# Patient Record
Sex: Female | Born: 1985 | Race: White | Hispanic: No | Marital: Married | Smoking: Never smoker
Health system: Southern US, Community
[De-identification: ages and names within clinical notes are randomized; demographics above are authoritative.]

## PROBLEM LIST (undated history)

## (undated) DIAGNOSIS — J45909 Unspecified asthma, uncomplicated: Secondary | ICD-10-CM

## (undated) DIAGNOSIS — M549 Dorsalgia, unspecified: Secondary | ICD-10-CM

## (undated) HISTORY — DX: Dorsalgia, unspecified: M54.9

## (undated) HISTORY — PX: NO PAST SURGERIES: SHX2092

---

## 2015-11-24 DIAGNOSIS — M5116 Intervertebral disc disorders with radiculopathy, lumbar region: Secondary | ICD-10-CM | POA: Insufficient documentation

## 2017-09-11 DIAGNOSIS — Z8041 Family history of malignant neoplasm of ovary: Secondary | ICD-10-CM | POA: Insufficient documentation

## 2018-12-30 ENCOUNTER — Ambulatory Visit: Payer: PRIVATE HEALTH INSURANCE | Admitting: General Practice

## 2018-12-30 ENCOUNTER — Other Ambulatory Visit: Payer: Self-pay

## 2018-12-30 DIAGNOSIS — Z3201 Encounter for pregnancy test, result positive: Secondary | ICD-10-CM | POA: Diagnosis not present

## 2018-12-30 LAB — POCT PREGNANCY, URINE: Preg Test, Ur: POSITIVE — AB

## 2018-12-30 NOTE — Progress Notes (Signed)
Patient presents to office today for UPT. UPT +. Patient reports first positive home test 11/22. LMP 11/12/18 EDD 08/19/19 [redacted]w[redacted]d. Patient reports taking PNV, Zyrtec, & Benadryl. This is her first pregnancy and she does not report significant medical hx. Encouraged patient to begin care around 10-12 weeks. She lives in [place] so she will contact [facility] office to begin care. 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 Advised against exposure to x-rays without precautions and discussed dietary recommendations. Patient verbalized understanding & had no other questions.  Koren Bound RN BSN 12/30/18

## 2019-01-22 NOTE — L&D Delivery Note (Signed)
OB/GYN Faculty Practice Delivery Note  Tracy Combs is a 34 y.o. G1P0 s/p VAVD at [redacted]w[redacted]d. She was admitted for SROM.   ROM: 6h 57m with clear fluid GBS Status: Positive/-- (06/28 0941) (inadequate treatment) Maximum Maternal Temperature: 98.80F  Labor Progress: . Initial SVE: 1.5/80/-2. Patient received one Cytotec and quickly progressed to complete. She did receive Epidural although not effective entirely for delivery.   Delivery Date/Time: 7/17 @ 1209 Delivery: Indication for operative vaginal delivery: Fetal bradycardia  Patient was examined and found to be fully dilated with fetal station of +2.  Patient's bladder was noted to be empty, and there were no known fetal contraindications to operative vaginal delivery. EFW was 3200g by [place]/recent ultrasound.  FHR tracing remarkable for heart rate in 80's for 8-9 minutes.  Risks of vacuum assistance were discussed in detail, including but not limited to, bleeding, infection, damage to maternal tissues, fetal cephalohematoma, inability to effect vaginal delivery of the head or shoulder dystocia that cannot be resolved by established maneuvers and need for emergency cesarean section.  Patient gave verbal consent.  The soft Kiwi vacuum cup was positioned over the sagittal suture 3 cm anterior to posterior fontanelle.  Pressure was then increased to 500 mmHg, and the patient was instructed to push.  Pulling was administered along the pelvic curve while patient was pushing; there were 3 contractions and 1 popoffs.  Vacuum was reduced in between contractions. Head delivered LOA. No nuchal cord present. Shoulder and body delivered in usual fashion. Infant with spontaneous cry, placed on mother's abdomen, dried and stimulated. Cord clamped x 2 after 1-minute delay, and cut by FOB. Cord blood drawn. Placenta delivered spontaneously with gentle cord traction. Fundus firm with massage and Pitocin. Labia, perineum, vagina, and cervix inspected inspected with  1st degree perineal laceration which was repaired with 3-0 Vicryl in a standard fashion.  Baby Weight: 2750g  Placenta: Sent to L&D Complications: Vacuum-Assisted Lacerations: 1st degree perineal  EBL: 372 mL Analgesia: Epidural and local lidocaine for repair   Infant:  APGAR (1 MIN): 8   APGAR (5 MINS): 9   APGAR (10 MINS):     Barrington Ellison, MD OB Family Medicine Fellow, [facility] for [facility] [place] [facility] 08/07/2019, 12:30 PM

## 2019-01-29 NOTE — Progress Notes (Signed)
I have reviewed this chart and agree with the RN/CMA assessment and management.    Mekayla Soman C Tijah Hane, MD, FACOG Attending Physician, Faculty Practice [facility]  

## 2019-02-02 ENCOUNTER — Other Ambulatory Visit (HOSPITAL_COMMUNITY)
Admission: RE | Admit: 2019-02-02 | Discharge: 2019-02-02 | Disposition: A | Discharge status: Home / Self Care | Payer: PRIVATE HEALTH INSURANCE | Source: Ambulatory Visit | Attending: Advanced Practice Midwife | Admitting: Advanced Practice Midwife

## 2019-02-02 ENCOUNTER — Other Ambulatory Visit: Payer: Self-pay

## 2019-02-02 ENCOUNTER — Encounter: Payer: Self-pay | Admitting: Advanced Practice Midwife

## 2019-02-02 ENCOUNTER — Ambulatory Visit (INDEPENDENT_AMBULATORY_CARE_PROVIDER_SITE_OTHER): Payer: PRIVATE HEALTH INSURANCE | Admitting: Advanced Practice Midwife

## 2019-02-02 DIAGNOSIS — Z3401 Encounter for supervision of normal first pregnancy, first trimester: Secondary | ICD-10-CM | POA: Insufficient documentation

## 2019-02-02 DIAGNOSIS — O09511 Supervision of elderly primigravida, first trimester: Secondary | ICD-10-CM

## 2019-02-02 DIAGNOSIS — O09519 Supervision of elderly primigravida, unspecified trimester: Secondary | ICD-10-CM | POA: Insufficient documentation

## 2019-02-02 DIAGNOSIS — Z3A11 11 weeks gestation of pregnancy: Secondary | ICD-10-CM

## 2019-02-02 NOTE — Patient Instructions (Addendum)
Second Trimester of Pregnancy The second trimester is from week 14 through week 27 (months 4 through 6). The second trimester is often a time when you feel your best. Your body has adjusted to being pregnant, and you begin to feel better physically. Usually, morning sickness has lessened or quit completely, you may have more energy, and you may have an increase in appetite. The second trimester is also a time when the fetus is growing rapidly. At the end of the sixth month, the fetus is about 9 inches long and weighs about 1 pounds. You will likely begin to feel the baby move (quickening) between 16 and 20 weeks of pregnancy. Body changes during your second trimester Your body continues to go through many changes during your second trimester. The changes vary from woman to woman.  Your weight will continue to increase. You will notice your lower abdomen bulging out.  You may begin to get stretch marks on your hips, abdomen, and breasts.  You may develop headaches that can be relieved by medicines. The medicines should be approved by your health care provider.  You may urinate more often because the fetus is pressing on your bladder.  You may develop or continue to have heartburn as a result of your pregnancy.  You may develop constipation because certain hormones are causing the muscles that push waste through your intestines to slow down.  You may develop hemorrhoids or swollen, bulging veins (varicose veins).  You may have back pain. This is caused by: ? Weight gain. ? Pregnancy hormones that are relaxing the joints in your pelvis. ? A shift in weight and the muscles that support your balance.  Your breasts will continue to grow and they will continue to become tender.  Your gums may bleed and may be sensitive to brushing and flossing.  Dark spots or blotches (chloasma, mask of pregnancy) may develop on your face. This will likely fade after the baby is born.  A dark line from your  belly button to the pubic area (linea nigra) may appear. This will likely fade after the baby is born.  You may have changes in your hair. These can include thickening of your hair, rapid growth, and changes in texture. Some women also have hair loss during or after pregnancy, or hair that feels dry or thin. Your hair will most likely return to normal after your baby is born. What to expect at prenatal visits During a routine prenatal visit:  You will be weighed to make sure you and the fetus are growing normally.  Your blood pressure will be taken.  Your abdomen will be measured to track your baby's growth.  The fetal heartbeat will be listened to.  Any test results from the previous visit will be discussed. Your health care provider may ask you:  How you are feeling.  If you are feeling the baby move.  If you have had any abnormal symptoms, such as leaking fluid, bleeding, severe headaches, or abdominal cramping.  If you are using any tobacco products, including cigarettes, chewing tobacco, and electronic cigarettes.  If you have any questions. Other tests that may be performed during your second trimester include:  Blood tests that check for: ? Low iron levels (anemia). ? High blood sugar that affects pregnant women (gestational diabetes) between 24 and 28 weeks. ? Rh antibodies. This is to check for a protein on red blood cells (Rh factor).  Urine tests to check for infections, diabetes, or protein in the   urine.  An ultrasound to confirm the proper growth and development of the baby.  An amniocentesis to check for possible genetic problems.  Fetal screens for spina bifida and Down syndrome.  HIV (human immunodeficiency virus) testing. Routine prenatal testing includes screening for HIV, unless you choose not to have this test. Follow these instructions at home: Medicines  Follow your health care provider's instructions regarding medicine use. Specific medicines may be  either safe or unsafe to take during pregnancy.  Take a prenatal vitamin that contains at least 600 micrograms (mcg) of folic acid.  If you develop constipation, try taking a stool softener if your health care provider approves. Eating and drinking   Eat a balanced diet that includes fresh fruits and vegetables, whole grains, good sources of protein such as meat, eggs, or tofu, and low-fat dairy. Your health care provider will help you determine the amount of weight gain that is right for you.  Avoid raw meat and uncooked cheese. These carry germs that can cause birth defects in the baby.  If you have low calcium intake from food, talk to your health care provider about whether you should take a daily calcium supplement.  Limit foods that are high in fat and processed sugars, such as fried and sweet foods.  To prevent constipation: ? Drink enough fluid to keep your urine clear or pale yellow. ? Eat foods that are high in fiber, such as fresh fruits and vegetables, whole grains, and beans. Activity  Exercise only as directed by your health care provider. Most women can continue their usual exercise routine during pregnancy. Try to exercise for 30 minutes at least 5 days a week. Stop exercising if you experience uterine contractions.  Avoid heavy lifting, wear low heel shoes, and practice good posture.  A sexual relationship may be continued unless your health care provider directs you otherwise. Relieving pain and discomfort  Wear a good support bra to prevent discomfort from breast tenderness.  Take warm sitz baths to soothe any pain or discomfort caused by hemorrhoids. Use hemorrhoid cream if your health care provider approves.  Rest with your legs elevated if you have leg cramps or low back pain.  If you develop varicose veins, wear support hose. Elevate your feet for 15 minutes, 3-4 times a day. Limit salt in your diet. Prenatal Care  Write down your questions. Take them to  your prenatal visits.  Keep all your prenatal visits as told by your health care provider. This is important. Safety  Wear your seat belt at all times when driving.  Make a list of emergency phone numbers, including numbers for family, friends, the hospital, and police and fire departments. General instructions  Ask your health care provider for a referral to a local prenatal education class. Begin classes no later than the beginning of month 6 of your pregnancy.  Ask for help if you have counseling or nutritional needs during pregnancy. Your health care provider can offer advice or refer you to specialists for help with various needs.  Do not use hot tubs, steam rooms, or saunas.  Do not douche or use tampons or scented sanitary pads.  Do not cross your legs for long periods of time.  Avoid cat litter boxes and soil used by cats. These carry germs that can cause birth defects in the baby and possibly loss of the fetus by miscarriage or stillbirth.  Avoid all smoking, herbs, alcohol, and unprescribed drugs. Chemicals in these products can affect the formation   and growth of the baby.  Do not use any products that contain nicotine or tobacco, such as cigarettes and e-cigarettes. If you need help quitting, ask your health care provider.  Visit your dentist if you have not gone yet during your pregnancy. Use a soft toothbrush to brush your teeth and be gentle when you floss. Contact a health care provider if:  You have dizziness.  You have mild pelvic cramps, pelvic pressure, or nagging pain in the abdominal area.  You have persistent nausea, vomiting, or diarrhea.  You have a bad smelling vaginal discharge.  You have pain when you urinate. Get help right away if:  You have a fever.  You are leaking fluid from your vagina.  You have spotting or bleeding from your vagina.  You have severe abdominal cramping or pain.  You have rapid weight gain or weight loss.  You have  shortness of breath with chest pain.  You notice sudden or extreme swelling of your face, hands, ankles, feet, or legs.  You have not felt your baby move in over an hour.  You have severe headaches that do not go away when you take medicine.  You have vision changes. Summary  The second trimester is from week 14 through week 27 (months 4 through 6). It is also a time when the fetus is growing rapidly.  Your body goes through many changes during pregnancy. The changes vary from woman to woman.  Avoid all smoking, herbs, alcohol, and unprescribed drugs. These chemicals affect the formation and growth your baby.  Do not use any tobacco products, such as cigarettes, chewing tobacco, and e-cigarettes. If you need help quitting, ask your health care provider.  Contact your health care provider if you have any questions. Keep all prenatal visits as told by your health care provider. This is important. This information is not intended to replace advice given to you by your health care provider. Make sure you discuss any questions you have with your health care provider. Document Revised: 05/01/2018 Document Reviewed: 02/13/2016 Elsevier Patient Education  [place] of Pregnancy The first trimester of pregnancy is from week 1 until the end of week 13 (months 1 through 3). A week after a sperm fertilizes an egg, the egg will implant on the wall of the uterus. This embryo will begin to develop into a baby. Genes from you and your partner will form the baby. The female genes will determine whether the baby will be a boy or a girl. At 6-8 weeks, the eyes and face will be formed, and the heartbeat can be seen on ultrasound. At the end of 12 weeks, all the baby's organs will be formed. Now that you are pregnant, you will want to do everything you can to have a healthy baby. Two of the most important things are to get good prenatal care and to follow your health care provider's  instructions. Prenatal care is all the medical care you receive before the baby's birth. This care will help prevent, find, and treat any problems during the pregnancy and childbirth. Body changes during your first trimester Your body goes through many changes during pregnancy. The changes vary from woman to woman.  You may gain or lose a couple of pounds at first.  You may feel sick to your stomach (nauseous) and you may throw up (vomit). If the vomiting is uncontrollable, call your health care provider.  You may tire easily.  You may develop headaches that  can be relieved by medicines. All medicines should be approved by your health care provider.  You may urinate more often. Painful urination may mean you have a bladder infection.  You may develop heartburn as a result of your pregnancy.  You may develop constipation because certain hormones are causing the muscles that push stool through your intestines to slow down.  You may develop hemorrhoids or swollen veins (varicose veins).  Your breasts may begin to grow larger and become tender. Your nipples may stick out more, and the tissue that surrounds them (areola) may become darker.  Your gums may bleed and may be sensitive to brushing and flossing.  Dark spots or blotches (chloasma, mask of pregnancy) may develop on your face. This will likely fade after the baby is born.  Your menstrual periods will stop.  You may have a loss of appetite.  You may develop cravings for certain kinds of food.  You may have changes in your emotions from day to day, such as being excited to be pregnant or being concerned that something may go wrong with the pregnancy and baby.  You may have more vivid and strange dreams.  You may have changes in your hair. These can include thickening of your hair, rapid growth, and changes in texture. Some women also have hair loss during or after pregnancy, or hair that feels dry or thin. Your hair will most  likely return to normal after your baby is born. What to expect at prenatal visits During a routine prenatal visit:  You will be weighed to make sure you and the baby are growing normally.  Your blood pressure will be taken.  Your abdomen will be measured to track your baby's growth.  The fetal heartbeat will be listened to between weeks 10 and 14 of your pregnancy.  Test results from any previous visits will be discussed. Your health care provider may ask you:  How you are feeling.  If you are feeling the baby move.  If you have had any abnormal symptoms, such as leaking fluid, bleeding, severe headaches, or abdominal cramping.  If you are using any tobacco products, including cigarettes, chewing tobacco, and electronic cigarettes.  If you have any questions. Other tests that may be performed during your first trimester include:  Blood tests to find your blood type and to check for the presence of any previous infections. The tests will also be used to check for low iron levels (anemia) and protein on red blood cells (Rh antibodies). Depending on your risk factors, or if you previously had diabetes during pregnancy, you may have tests to check for high blood sugar that affects pregnant women (gestational diabetes).  Urine tests to check for infections, diabetes, or protein in the urine.  An ultrasound to confirm the proper growth and development of the baby.  Fetal screens for spinal cord problems (spina bifida) and Down syndrome.  HIV (human immunodeficiency virus) testing. Routine prenatal testing includes screening for HIV, unless you choose not to have this test.  You may need other tests to make sure you and the baby are doing well. Follow these instructions at home: Medicines  Follow your health care provider's instructions regarding medicine use. Specific medicines may be either safe or unsafe to take during pregnancy.  Take a prenatal vitamin that contains at least  600 micrograms (mcg) of folic acid.  If you develop constipation, try taking a stool softener if your health care provider approves. Eating and drinking  Eat a balanced diet that includes fresh fruits and vegetables, whole grains, good sources of protein such as meat, eggs, or tofu, and low-fat dairy. Your health care provider will help you determine the amount of weight gain that is right for you.  Avoid raw meat and uncooked cheese. These carry germs that can cause birth defects in the baby.  Eating four or five small meals rather than three large meals a day may help relieve nausea and vomiting. If you start to feel nauseous, eating a few soda crackers can be helpful. Drinking liquids between meals, instead of during meals, also seems to help ease nausea and vomiting.  Limit foods that are high in fat and processed sugars, such as fried and sweet foods.  To prevent constipation: ? Eat foods that are high in fiber, such as fresh fruits and vegetables, whole grains, and beans. ? Drink enough fluid to keep your urine clear or pale yellow. Activity  Exercise only as directed by your health care provider. Most women can continue their usual exercise routine during pregnancy. Try to exercise for 30 minutes at least 5 days a week. Exercising will help you: ? Control your weight. ? Stay in shape. ? Be prepared for labor and delivery.  Experiencing pain or cramping in the lower abdomen or lower back is a good sign that you should stop exercising. Check with your health care provider before continuing with normal exercises.  Try to avoid standing for long periods of time. Move your legs often if you must stand in one place for a long time.  Avoid heavy lifting.  Wear low-heeled shoes and practice good posture.  You may continue to have sex unless your health care provider tells you not to. Relieving pain and discomfort  Wear a good support bra to relieve breast tenderness.  Take warm  sitz baths to soothe any pain or discomfort caused by hemorrhoids. Use hemorrhoid cream if your health care provider approves.  Rest with your legs elevated if you have leg cramps or low back pain.  If you develop varicose veins in your legs, wear support hose. Elevate your feet for 15 minutes, 3-4 times a day. Limit salt in your diet. Prenatal care  Schedule your prenatal visits by the twelfth week of pregnancy. They are usually scheduled monthly at first, then more often in the last 2 months before delivery.  Write down your questions. Take them to your prenatal visits.  Keep all your prenatal visits as told by your health care provider. This is important. Safety  Wear your seat belt at all times when driving.  Make a list of emergency phone numbers, including numbers for family, friends, the hospital, and police and fire departments. General instructions  Ask your health care provider for a referral to a local prenatal education class. Begin classes no later than the beginning of month 6 of your pregnancy.  Ask for help if you have counseling or nutritional needs during pregnancy. Your health care provider can offer advice or refer you to specialists for help with various needs.  Do not use hot tubs, steam rooms, or saunas.  Do not douche or use tampons or scented sanitary pads.  Do not cross your legs for long periods of time.  Avoid cat litter boxes and soil used by cats. These carry germs that can cause birth defects in the baby and possibly loss of the fetus by miscarriage or stillbirth.  Avoid all smoking, herbs, alcohol, and medicines not prescribed  by your health care provider. Chemicals in these products affect the formation and growth of the baby.  Do not use any products that contain nicotine or tobacco, such as cigarettes and e-cigarettes. If you need help quitting, ask your health care provider. You may receive counseling support and other resources to help you  quit.  Schedule a dentist appointment. At home, brush your teeth with a soft toothbrush and be gentle when you floss. Contact a health care provider if:  You have dizziness.  You have mild pelvic cramps, pelvic pressure, or nagging pain in the abdominal area.  You have persistent nausea, vomiting, or diarrhea.  You have a bad smelling vaginal discharge.  You have pain when you urinate.  You notice increased swelling in your face, hands, legs, or ankles.  You are exposed to fifth disease or chickenpox.  You are exposed to [place] measles (rubella) and have never had it. Get help right away if:  You have a fever.  You are leaking fluid from your vagina.  You have spotting or bleeding from your vagina.  You have severe abdominal cramping or pain.  You have rapid weight gain or loss.  You vomit blood or material that looks like coffee grounds.  You develop a severe headache.  You have shortness of breath.  You have any kind of trauma, such as from a fall or a car accident. Summary  The first trimester of pregnancy is from week 1 until the end of week 13 (months 1 through 3).  Your body goes through many changes during pregnancy. The changes vary from woman to woman.  You will have routine prenatal visits. During those visits, your health care provider will examine you, discuss any test results you may have, and talk with you about how you are feeling. This information is not intended to replace advice given to you by your health care provider. Make sure you discuss any questions you have with your health care provider. Document Revised: 12/20/2016 Document Reviewed: 12/20/2015 Elsevier Patient Education  2020 [place] During Pregnancy Genetic testing during pregnancy is also called prenatal genetic testing. This type of testing can determine if your baby is at risk of being born with a disorder caused by abnormal genes or chromosomes (genetic  disorder). Chromosomes contain genes that control how your baby will develop in your womb. There are many different genetic disorders. Examples of genetic disorders that may be found through genetic testing include Down syndrome and cystic fibrosis. Gene changes (mutations) can be passed down through families. Genetic testing is offered to all women before or during pregnancy. You can choose whether to have genetic testing. Why is genetic testing done? Genetic testing is done during pregnancy to find out whether your child is at risk for a genetic disorder. Having genetic testing allows you to:  Discuss your test results and options with a genetic counselor.  Prepare for a baby that may be born with a genetic disorder. Learning about the disorder ahead of time helps you be better prepared to manage it. Your health care providers can also be prepared in case your baby requires special care before or after birth.  Consider whether you want to continue with the pregnancy. In some cases, genetic testing may be done to learn about the traits a child will inherit. Types of genetic tests There are two basic types of genetic testing. Screening tests indicate whether your developing baby (fetus) is at higher risk for a genetic  disorder. Diagnostic tests check actual fetal cells to diagnose a genetic disorder. Screening tests     Screening tests will not harm your baby. They are recommended for all pregnant women. Types of screening tests include:  Carrier screening. This test involves checking genes from both parents by testing their blood or saliva. The test checks to find out if the parents carry a genetic mutation that may be passed to a baby. In most cases, both parents must carry the mutation for a baby to be at risk.  First trimester screening. This test combines a blood test with sound wave imaging of your baby (fetal ultrasound). This screening test checks for a risk of Down syndrome or other  defects caused by having extra chromosomes. It also checks for defects of the heart, abdomen, or skeleton.  Second trimester screening also combines a blood test with a fetal ultrasound exam. It checks for a risk of genetic defects of the face, brain, spine, heart, or limbs.  Combined or sequential screening. This type of testing combines the results of first and second trimester screening. This type of testing may be more accurate than first or second trimester screening alone.  Cell-free DNA testing. This is a blood test that detects cells released by the placenta that get into the mother's blood. It can be used to check for a risk of Down syndrome, other extra chromosome syndromes, and disorders caused by abnormal numbers of sex chromosomes. This test can be done any time after 10 weeks of pregnancy.  Diagnostic tests Diagnostic tests carry slight risks of problems, including bleeding, infection, and loss of the pregnancy. These tests are done only if your baby is at risk for a genetic disorder. You may meet with a genetic counselor to discuss the risks and benefits before having diagnostic tests. Examples of diagnostic tests include:  Chorionic villus sampling (CVS). This involves a procedure to remove and test a sample of cells taken from the placenta. The procedure may be done between 10 and 12 weeks of pregnancy.  Amniocentesis. This involves a procedure to remove and test a sample of fluid (amniotic fluid) and cells from the sac that surrounds the developing baby. The procedure may be done between 15 and 20 weeks of pregnancy. What do the results mean? For a screening test:  If the results are negative, it often means that your child is not at higher risk. There is still a slight chance your child could have a genetic disorder.  If the results are positive, it does not mean your child will have a genetic disorder. It may mean that your child has a higher-than-normal risk for a genetic  disorder. In that case, you may want to talk with a genetic counselor about whether you should have diagnostic genetic tests. For a diagnostic test:  If the result is negative, it is unlikely that your child will have a genetic disorder.  If the test is positive for a genetic disorder, it is likely that your child will have the disorder. The test may not tell how severe the disorder will be. Talk with your health care provider about your options. Questions to ask your health care provider Before talking to your health care provider about genetic testing, find out if there is a history of genetic disorders in your family. It may also help to know your family's ethnic origins. Then ask your health care provider the following questions:  Is my baby at risk for a genetic disorder?  What  are the benefits of having genetic screening?  What tests are best for me and my baby?  What are the risks of each test?  If I get a positive result on a screening test, what is the next step?  Should I meet with a genetic counselor before having a diagnostic test?  Should my partner or other members of my family be tested?  How much do the tests cost? Will my insurance cover the testing? Summary  Genetic testing is done during pregnancy to find out whether your child is at risk for a genetic disorder.  Genetic testing is offered to all women before or during pregnancy. You can choose whether to have genetic testing.  There are two basic types of genetic testing. Screening tests indicate whether your developing baby (fetus) is at higher risk for a genetic disorder. Diagnostic tests check actual fetal cells to diagnose a genetic disorder.  If a diagnostic genetic test is positive, talk with your health care provider about your options. This information is not intended to replace advice given to you by your health care provider. Make sure you discuss any questions you have with your health care  provider. Document Revised: 04/30/2018 Document Reviewed: 03/24/2017 Elsevier Patient Education  [place].

## 2019-02-02 NOTE — Progress Notes (Signed)
DATING AND VIABILITY SONOGRAM   Tracy Combs is a 34 y.o. year old G1P0 with LMP Patient's last menstrual period was 11/12/2018 (exact date). which would correlate to  [redacted]w[redacted]d weeks gestation.  She has regular menstrual cycles.   She is here today for a confirmatory initial sonogram.    GESTATION: SINGLETON  FETAL ACTIVITY:          Heart rate         158 bpm          The fetus is active.    GESTATIONAL AGE AND  BIOMETRICS:  Gestational criteria: Estimated Date of Delivery: 08/19/19 by LMP now at [redacted]w[redacted]d  Previous Scans:0      CROWN RUMP LENGTH           5.10 cm         11-5 weeks                                                                               AVERAGE EGA(BY THIS SCAN): 11-5 weeks  WORKING EDD( LMP ): 08/19/2019     TECHNICIAN COMMENTS: Patient informed that the ultrasound is considered a limited obstetric ultrasound and is not intended to be a complete ultrasound exam. Patient also informed that the ultrasound is not being completed with the intent of assessing for fetal or placental anomalies or any pelvic abnormalities. Explained that the purpose of today's ultrasound is to assess for fetal heart rate. Patient acknowledges the purpose of the exam and the limitations of the study   Kathrene Alu 02/02/2019 9:41 AM

## 2019-02-02 NOTE — Progress Notes (Signed)
   PRENATAL VISIT NOTE  Subjective:  Tracy Combs is a 34 y.o. G1P0 at [redacted]w[redacted]d being seen today for her first prenatal visit for this pregnancy.  She is currently monitored for the following issues for this low-risk pregnancy and has Encounter for supervision of normal first pregnancy in first trimester; AMA (advanced maternal age) primigravida 56+; Family history of ovarian cancer; and Intervertebral disc disorders with radiculopathy, lumbar region on their problem list.  Patient reports no complaints.  Contractions: Not present. Vag. Bleeding: None.   . Denies leaking of fluid.   She is planning to breastfeed. Desires IUD (probably)  for contraception.   Works as a Occupational psychologist in [place]  The following portions of the patient's history were reviewed and updated as appropriate: allergies, current medications, past family history, past medical history, past social history, past surgical history and problem list.   Objective:   Vitals:   02/02/19 0908 02/02/19 0909  BP: 134/81   Pulse: 90   Weight: 154 lb (69.9 kg)   Height:  5\' 1"  (1.549 m)    Fetal Status: Fetal Heart Rate (bpm): 158         General:  Alert, oriented and cooperative. Patient is in no acute distress.  Skin: Skin is warm and dry. No rash noted.   Cardiovascular: Normal heart rate and rhythm noted  Respiratory: Normal respiratory effort, no problems with respiration noted. Clear to auscultation.   Abdomen: Soft, gravid, appropriate for gestational age. Normal bowel sounds. Non-tender. Pain/Pressure: Absent     Pelvic: Cervical exam performed       Normal cervical contour, no lesions, no bleeding following pap, normal discharge     Pap done  Extremities: Normal range of motion.  Edema: None  Mental Status: Normal mood and affect. Normal behavior. Normal judgment and thought content.   Assessment and Plan:  Pregnancy: G1P0 at [redacted]w[redacted]d 1. Encounter for supervision of normal first pregnancy in first trimester  Welcomed to practice     Reviewed team and involvement of learners     Routines reviewed     Discussed Panorama, may want to do it (recommended after 12 weeks with AFP after 15w)  - Obstetric Panel (and HIV)*LC - Urine Culture - Cytology - PAP( [place]) - Enroll Patient in Babyscripts - Cystic Fibrosis*LC - SMN1 Copy Number Analysis - [place] MFM OB COMP + 36 WK; Future  2. Primigravida of advanced maternal age in first trimester  - Obstetric Panel (and HIV)*LC - Urine Culture - Cytology - PAP( [place]) - Enroll Patient in Babyscripts - Cystic Fibrosis*LC - SMN1 Copy Number Analysis  Preterm labor symptoms and general obstetric precautions including but not limited to vaginal bleeding, contractions, leaking of fluid and fetal movement were reviewed in detail with the patient. Please refer to After Visit Summary for other counseling recommendations.   Return in about 4 weeks (around 03/02/2019) for [facility].  Future Appointments  Date Time Provider [place]  03/02/2019  9:15 AM Seabron Spates, CNM [facility] None  03/08/2019  9:30 AM [place] 1 [facility]-MFCUS M[facility]-US    Hansel Feinstein, CNM

## 2019-02-03 LAB — OBSTETRIC PANEL, INCLUDING HIV
Antibody Screen: NEGATIVE
Basophils Absolute: 0 10*3/uL (ref 0.0–0.2)
Basos: 0 %
EOS (ABSOLUTE): 0.1 10*3/uL (ref 0.0–0.4)
Eos: 2 %
HIV Screen 4th Generation wRfx: NONREACTIVE
Hematocrit: 37 % (ref 34.0–46.6)
Hemoglobin: 12.5 g/dL (ref 11.1–15.9)
Hepatitis B Surface Ag: NEGATIVE
Immature Grans (Abs): 0 10*3/uL (ref 0.0–0.1)
Immature Granulocytes: 0 %
Lymphocytes Absolute: 1.2 10*3/uL (ref 0.7–3.1)
Lymphs: 19 %
MCH: 31.6 pg (ref 26.6–33.0)
MCHC: 33.8 g/dL (ref 31.5–35.7)
MCV: 93 fL (ref 79–97)
Monocytes Absolute: 0.4 10*3/uL (ref 0.1–0.9)
Monocytes: 6 %
Neutrophils Absolute: 4.7 10*3/uL (ref 1.4–7.0)
Neutrophils: 73 %
Platelets: 223 10*3/uL (ref 150–450)
RBC: 3.96 x10E6/uL (ref 3.77–5.28)
RDW: 12.1 % (ref 11.7–15.4)
RPR Ser Ql: NONREACTIVE
Rh Factor: POSITIVE
Rubella Antibodies, IGG: 7.46 index (ref >0.99)
WBC: 6.4 10*3/uL (ref 3.4–10.8)

## 2019-02-04 LAB — URINE CULTURE

## 2019-02-05 LAB — CYTOLOGY - PAP
Chlamydia: NEGATIVE
Comment: NEGATIVE
Comment: NEGATIVE
Comment: NORMAL
Diagnosis: NEGATIVE
High risk HPV: NEGATIVE
Neisseria Gonorrhea: NEGATIVE

## 2019-02-15 LAB — SMN1 COPY NUMBER ANALYSIS (SMA CARRIER SCREENING)

## 2019-02-15 LAB — CYSTIC FIBROSIS MUTATION 97: Interpretation: NOT DETECTED

## 2019-03-02 ENCOUNTER — Encounter: Payer: Self-pay | Admitting: Advanced Practice Midwife

## 2019-03-02 ENCOUNTER — Ambulatory Visit (INDEPENDENT_AMBULATORY_CARE_PROVIDER_SITE_OTHER): Payer: PRIVATE HEALTH INSURANCE | Admitting: Advanced Practice Midwife

## 2019-03-02 ENCOUNTER — Other Ambulatory Visit: Payer: Self-pay

## 2019-03-02 VITALS — BP 115/65 | HR 86 | Wt 156.1 lb

## 2019-03-02 DIAGNOSIS — O09512 Supervision of elderly primigravida, second trimester: Secondary | ICD-10-CM

## 2019-03-02 DIAGNOSIS — Z3401 Encounter for supervision of normal first pregnancy, first trimester: Secondary | ICD-10-CM

## 2019-03-02 DIAGNOSIS — Z3482 Encounter for supervision of other normal pregnancy, second trimester: Secondary | ICD-10-CM

## 2019-03-02 DIAGNOSIS — Z3A15 15 weeks gestation of pregnancy: Secondary | ICD-10-CM

## 2019-03-02 NOTE — Patient Instructions (Signed)

## 2019-03-02 NOTE — Progress Notes (Signed)
   PRENATAL VISIT NOTE  Subjective:  Tracy Combs is a 34 y.o. G1P0 at [redacted]w[redacted]d being seen today for ongoing prenatal care.  She is currently monitored for the following issues for this low-risk pregnancy and has Encounter for supervision of normal first pregnancy in first trimester; AMA (advanced maternal age) primigravida 80+; Family history of ovarian cancer; and Intervertebral disc disorders with radiculopathy, lumbar region on their problem list.  Patient reports intermittent sharp pains in abdomen, no cramps.  Contractions: Not present. Vag. Bleeding: None.  Movement: Absent. Denies leaking of fluid.   The following portions of the patient's history were reviewed and updated as appropriate: allergies, current medications, past family history, past medical history, past social history, past surgical history and problem list.   Objective:   Vitals:   03/02/19 0926  BP: 115/65  Pulse: 86  Weight: 156 lb 1.3 oz (70.8 kg)    Fetal Status:     Movement: Absent     General:  Alert, oriented and cooperative. Patient is in no acute distress.  Skin: Skin is warm and dry. No rash noted.   Cardiovascular: Normal heart rate noted  Respiratory: Normal respiratory effort, no problems with respiration noted  Abdomen: Soft, gravid, appropriate for gestational age.  Pain/Pressure: Present     Pelvic: Cervical exam deferred        Extremities: Normal range of motion.  Edema: None  Mental Status: Normal mood and affect. Normal behavior. Normal judgment and thought content.   Assessment and Plan:  Pregnancy: G1P0 at [redacted]w[redacted]d 1. Encounter for supervision of other normal pregnancy in second trimester     Panorama and AFP today     Has [place] scheduled 03/22/19  Preterm labor symptoms and general obstetric precautions including but not limited to vaginal bleeding, contractions, leaking of fluid and fetal movement were reviewed in detail with the patient. Please refer to After Visit Summary for other counseling  recommendations.   Return in about 4 weeks (around 03/30/2019) for [employer], TELEHEALTH VISIT.  Future Appointments  Date Time Provider [place]  03/08/2019  9:30 AM [facility] [place]    Hansel Feinstein, CNM

## 2019-03-04 LAB — AFP, SERUM, OPEN SPINA BIFIDA
AFP MoM: 1.32
AFP Value: 40 ng/mL
Gest. Age on Collection Date: 15.5 weeks
Maternal Age At EDD: 35 yr
OSBR Risk 1 IN: 4529
Test Results:: NEGATIVE
Weight: 156 [lb_av]

## 2019-03-08 ENCOUNTER — Ambulatory Visit (HOSPITAL_COMMUNITY): Payer: PRIVATE HEALTH INSURANCE

## 2019-03-22 ENCOUNTER — Other Ambulatory Visit: Payer: Self-pay

## 2019-03-22 ENCOUNTER — Other Ambulatory Visit (HOSPITAL_COMMUNITY): Payer: Self-pay | Admitting: *Deleted

## 2019-03-22 ENCOUNTER — Other Ambulatory Visit: Payer: Self-pay | Admitting: Advanced Practice Midwife

## 2019-03-22 ENCOUNTER — Ambulatory Visit (HOSPITAL_COMMUNITY)
Admission: RE | Admit: 2019-03-22 | Discharge: 2019-03-22 | Disposition: A | Discharge status: Home / Self Care | Payer: PRIVATE HEALTH INSURANCE | Source: Ambulatory Visit | Attending: Advanced Practice Midwife | Admitting: Advanced Practice Midwife

## 2019-03-22 DIAGNOSIS — Z3689 Encounter for other specified antenatal screening: Secondary | ICD-10-CM | POA: Diagnosis not present

## 2019-03-22 DIAGNOSIS — O3412 Maternal care for benign tumor of corpus uteri, second trimester: Secondary | ICD-10-CM | POA: Insufficient documentation

## 2019-03-22 DIAGNOSIS — O09512 Supervision of elderly primigravida, second trimester: Secondary | ICD-10-CM | POA: Diagnosis not present

## 2019-03-22 DIAGNOSIS — O09522 Supervision of elderly multigravida, second trimester: Secondary | ICD-10-CM

## 2019-03-22 DIAGNOSIS — Z3401 Encounter for supervision of normal first pregnancy, first trimester: Secondary | ICD-10-CM

## 2019-03-22 DIAGNOSIS — D259 Leiomyoma of uterus, unspecified: Secondary | ICD-10-CM | POA: Diagnosis not present

## 2019-03-22 DIAGNOSIS — O358XX0 Maternal care for other (suspected) fetal abnormality and damage, not applicable or unspecified: Secondary | ICD-10-CM | POA: Diagnosis not present

## 2019-03-22 DIAGNOSIS — Z363 Encounter for antenatal screening for malformations: Secondary | ICD-10-CM | POA: Diagnosis not present

## 2019-03-22 DIAGNOSIS — Z3A18 18 weeks gestation of pregnancy: Secondary | ICD-10-CM | POA: Diagnosis not present

## 2019-03-23 ENCOUNTER — Encounter: Payer: Self-pay | Admitting: Advanced Practice Midwife

## 2019-03-23 DIAGNOSIS — D259 Leiomyoma of uterus, unspecified: Secondary | ICD-10-CM | POA: Insufficient documentation

## 2019-04-01 ENCOUNTER — Encounter (HOSPITAL_COMMUNITY): Payer: Self-pay | Admitting: Family Medicine

## 2019-04-01 ENCOUNTER — Inpatient Hospital Stay (HOSPITAL_COMMUNITY)
Admission: AD | Admit: 2019-04-01 | Discharge: 2019-04-02 | Disposition: A | Discharge status: Home / Self Care | Payer: PRIVATE HEALTH INSURANCE | Attending: Family Medicine | Admitting: Family Medicine

## 2019-04-01 ENCOUNTER — Encounter: Payer: Self-pay | Admitting: Obstetrics & Gynecology

## 2019-04-01 ENCOUNTER — Other Ambulatory Visit: Payer: Self-pay

## 2019-04-01 ENCOUNTER — Telehealth (INDEPENDENT_AMBULATORY_CARE_PROVIDER_SITE_OTHER): Payer: PRIVATE HEALTH INSURANCE | Admitting: Obstetrics & Gynecology

## 2019-04-01 VITALS — BP 111/61 | Wt 161.0 lb

## 2019-04-01 DIAGNOSIS — O26899 Other specified pregnancy related conditions, unspecified trimester: Secondary | ICD-10-CM

## 2019-04-01 DIAGNOSIS — R102 Pelvic and perineal pain: Secondary | ICD-10-CM

## 2019-04-01 DIAGNOSIS — O26892 Other specified pregnancy related conditions, second trimester: Secondary | ICD-10-CM | POA: Insufficient documentation

## 2019-04-01 DIAGNOSIS — Z3A20 20 weeks gestation of pregnancy: Secondary | ICD-10-CM | POA: Insufficient documentation

## 2019-04-01 DIAGNOSIS — R1031 Right lower quadrant pain: Secondary | ICD-10-CM | POA: Insufficient documentation

## 2019-04-01 DIAGNOSIS — Z3401 Encounter for supervision of normal first pregnancy, first trimester: Secondary | ICD-10-CM

## 2019-04-01 DIAGNOSIS — S39012A Strain of muscle, fascia and tendon of lower back, initial encounter: Secondary | ICD-10-CM | POA: Insufficient documentation

## 2019-04-01 DIAGNOSIS — Z880 Allergy status to penicillin: Secondary | ICD-10-CM | POA: Insufficient documentation

## 2019-04-01 DIAGNOSIS — O09519 Supervision of elderly primigravida, unspecified trimester: Secondary | ICD-10-CM

## 2019-04-01 DIAGNOSIS — O3410 Maternal care for benign tumor of corpus uteri, unspecified trimester: Secondary | ICD-10-CM

## 2019-04-01 DIAGNOSIS — D259 Leiomyoma of uterus, unspecified: Secondary | ICD-10-CM

## 2019-04-01 NOTE — MAU Note (Signed)
PT SAYS THINKS SHE PULLED MUSCLE IN BACK- AT NOON  SHE'S A VET.  TONIGHT AT 630PM- FELT LOWER ABD PAIN.  DRANK WATER , TOOK TYLENOL AT 1 PM.

## 2019-04-01 NOTE — Progress Notes (Signed)
TELEHEALTH OBSTETRICS PRENATAL VIRTUAL VIDEO VISIT ENCOUNTER NOTE  Provider location: [facility]   I connected with [name] on 04/01/19 at  1:15 PM EST by MyChart Video Encounter at home and verified that I am speaking with the correct person using two identifiers.   I discussed the limitations, risks, security and privacy concerns of performing an evaluation and management service virtually and the availability of in person appointments. I also discussed with the patient that there may be a patient responsible charge related to this service. The patient expressed understanding and agreed to proceed. Subjective:  Tracy Combs is a 34 y.o. G1P0 at [redacted]w[redacted]d being seen today for ongoing prenatal care.  She is currently monitored for the following issues for this high-risk pregnancy and has Encounter for supervision of normal first pregnancy in first trimester; AMA (advanced maternal age) primigravida 51+; Family history of ovarian cancer; Intervertebral disc disorders with radiculopathy, lumbar region; and Uterine fibroid in pregnancy on their problem list.  Patient reports Pt was exposed to person at work with shingles. She has had chicken pox.   .  Contractions: Not present. Vag. Bleeding: None.  Movement: Absent. Denies any leaking of fluid.   The following portions of the patient's history were reviewed and updated as appropriate: allergies, current medications, past family history, past medical history, past social history, past surgical history and problem list.   Objective:   Vitals:   04/01/19 1308  BP: 111/61  Weight: 161 lb (73 kg)    Fetal Status:     Movement: Absent     General:  Alert, oriented and cooperative. Patient is in no acute distress.  Respiratory: Normal respiratory effort, no problems with respiration noted  Mental Status: Normal mood and affect. Normal behavior. Normal judgment and thought content.  Rest of physical exam  deferred due to type of encounter  Imaging: [place] MFM OB DETAIL +14 WK  Result Date: 03/22/2019 ----------------------------------------------------------------------  OBSTETRICS REPORT                       (Signed Final 03/22/2019 08:49 am) ---------------------------------------------------------------------- Patient Info  ID #:       [id number]                          D.O.B.:  Feb 15, 1985 (33 yrs)  Name:       Tracy Combs                     Visit Date: 03/22/2019 07:52 am ---------------------------------------------------------------------- Performed By  Performed By:     Valda Favia          Ref. Address:     [place]                    [place]  Attending:        Johnell Comings MD         [facility]  Referred By:      [facility] ---------------------------------------------------------------------- Orders   #  Description                          Code         Ordered By   1  [place] MFM OB DETAIL +14 WK              [id number]     Hansel Feinstein  ----------------------------------------------------------------------   #  Order #                    Accession #                 Episode #   [id number]  [id number]                  [id number]                  [id number]  ---------------------------------------------------------------------- Indications   Advanced maternal age primigravida 54+,        O30.512   second trimester   Uterine fibroids affecting pregnancy in        O34.12, D25.9   second trimester, antepartum   [redacted] weeks gestation of pregnancy                Z3A.18   Encounter for antenatal screening for          Z36.3   malformations   Low risk NIPS, 7.0FF, AFP neg  ---------------------------------------------------------------------- Fetal Evaluation  Num Of Fetuses:         1  Cardiac Activity:       Observed  Presentation:           Cephalic  Placenta:                Fundal  P. Cord Insertion:      Visualized, central  Amniotic Fluid  AFI FV:      Within normal limits                              Largest Pocket(cm)                              3.51 ---------------------------------------------------------------------- Biometry  BPD:      38.1  mm     G. Age:  17w 4d         13  %    CI:        62.52   %    70 - 86                                                          FL/HC:      17.2   %    16.1 - 18.3  HC:      155.5  mm     G. Age:  18w 3d         38  %    HC/AC:      1.09        1.09 - 1.39  AC:      142.7  mm  G. Age:  19w 4d         80  %    FL/BPD:     70.1   %  FL:       26.7  mm     G. Age:  18w 1d         28  %    FL/AC:      18.7   %    20 - 24  HUM:      26.8  mm     G. Age:  18w 4d         51  %  CER:      17.7  mm     G. Age:  17w 5d         25  %  NFT:       2.9  mm  CM:        4.3  mm  Est. FW:     260  gm      0 lb 9 oz     62  % ---------------------------------------------------------------------- OB History  Gravidity:    1         Term:   0        Prem:   0        SAB:   0  TOP:          0       Ectopic:  0        Living: 0 ---------------------------------------------------------------------- Gestational Age  LMP:           18w 4d        Date:  11/12/18                 EDD:   08/19/19  U/S Today:     18w 3d                                        EDD:   08/20/19  Best:          18w 4d     Det. By:  LMP  (11/12/18)          EDD:   08/19/19 ---------------------------------------------------------------------- Anatomy  Cranium:               Appears normal         LVOT:                   Not well visualized  Cavum:                 Appears normal         Aortic Arch:            Not well visualized  Ventricles:            Appears normal         Ductal Arch:            Not well visualized  Choroid Plexus:        Left choroid           Diaphragm:              Appears normal                         plexus cyst  Cerebellum:  Appears  normal         Stomach:                Appears normal, left                                                                        sided  Posterior Fossa:       Appears normal         Abdomen:                Appears normal  Nuchal Fold:           Appears normal         Abdominal Wall:         Not well visualized  Face:                  Appears normal         Cord Vessels:           Appears normal (3                         (orbits and profile)                           vessel cord)  Lips:                  Appears normal         Kidneys:                Appear normal  Palate:                Not well visualized    Bladder:                Appears normal  Thoracic:              Appears normal         Spine:                  Appears normal  Heart:                 Not well visualized    Upper Extremities:      Appears normal  RVOT:                  Not well visualized    Lower Extremities:      Appears normal  Other:  Heels visualized. Hands not well visualized. Technically difficult due          to fetal position. ---------------------------------------------------------------------- Cervix Uterus Adnexa  Cervix  Length:            4.1  cm.  Normal appearance by transabdominal scan.  Uterus  No abnormality visualized.  Left Ovary  No adnexal mass visualized.  Right Ovary  No adnexal mass visualized.  Cul De Sac  No free fluid seen.  Adnexa  No abnormality visualized. ---------------------------------------------------------------------- Myomas   Site                     L(cm)      W(cm)  D(cm)      Location   RT LUS                   8.3        6.5        8  ----------------------------------------------------------------------   Blood Flow                 RI        PI       Comments  ---------------------------------------------------------------------- Comments  This patient was seen for a detailed fetal anatomy scan due  to advanced maternal age. She denies any significant past  medical history and denies any  problems in her current  pregnancy.  She had a cell free DNA test earlier in her pregnancy which  indicated a low risk for trisomy 65, 73, and 13. A female fetus is  predicted.  She was informed that the fetal growth and amniotic fluid  level were appropriate for her gestational age.  On today's exam, a left choroid plexus cyst was noted in the  fetal brain.  The implications and management of choroid  plexus cysts were discussed today.  She was advised that the  choroid plexus cysts are most likely normal variants and will  usually resolve at around 24 weeks.  The small association of  choroid plexus cysts with trisomy 18 was discussed.  She  was advised that as no other anomalies were noted on  today's ultrasound exam, it is highly unlikely that her fetus  has trisomy 90.  Due to the small association between choroid plexus cysts  and trisomy 18, she was offered and declined an  amniocentesis today for definitive diagnosis of fetal  aneuploidy.  She is comfortable with her negative cell free  DNA test.  The views of the fetal heart were suboptimal today due to the  fetal position.  The patient was informed that anomalies may be missed due  to technical limitations. If the fetus is in a suboptimal position  or maternal habitus is increased, visualization of the fetus in  the maternal uterus may be impaired.  An 8.3 x 6.5 cm fibroid in the lower uterine segment was  noted today.  The increased risk of maternal pain issues and  fetal growth issues later in her pregnancy due to the fibroids  was discussed.  Due to the fibroid uterus, we will continue to follow her with  serial growth ultrasounds throughout her pregnancy.  A follow-up exam was scheduled in 4 weeks to complete the  views of the fetal anatomy and for follow-up of this choroid  plexus cyst noted today. ----------------------------------------------------------------------                   Johnell Comings, MD Electronically Signed Final Report   03/22/2019  08:49 am ----------------------------------------------------------------------   Assessment and Plan:  Pregnancy: G1P0 at [redacted]w[redacted]d 1. Encounter for supervision of normal first pregnancy in first trimester Pt with not problems  Has not had quickening yet.   2. Advanced maternal age, primigravida, antepartum  3. Uterine fibroid in pregnancy 8x6 cm in lower uterine segent. asymptomatic  Preterm labor symptoms and general obstetric precautions including but not limited to vaginal bleeding, contractions, leaking of fluid and fetal movement were reviewed in detail with the patient. I discussed the assessment and treatment plan with the patient. The patient was provided an opportunity to ask questions and all were answered. The patient agreed with the plan and demonstrated an  understanding of the instructions. The patient was advised to call back or seek an in-person office evaluation/go to MAU at [facility] for any urgent or concerning symptoms. Please refer to After Visit Summary for other counseling recommendations.   I provided 15 minutes of face-to-face time during this encounter.  Return in about 4 weeks (around 04/29/2019) for in person.  Future Appointments  Date Time Provider [place]  04/19/2019  8:00 AM [place] M[facility]-US  04/19/2019  8:00 AM [facility]-MFC [place] 3 WH-MFCUS MFC-US    Shyler Hamill Harraway-Smith, [place] for [facility], [place]

## 2019-04-02 DIAGNOSIS — Z3A20 20 weeks gestation of pregnancy: Secondary | ICD-10-CM | POA: Diagnosis not present

## 2019-04-02 DIAGNOSIS — S39012A Strain of muscle, fascia and tendon of lower back, initial encounter: Secondary | ICD-10-CM | POA: Diagnosis not present

## 2019-04-02 DIAGNOSIS — O26892 Other specified pregnancy related conditions, second trimester: Secondary | ICD-10-CM

## 2019-04-02 DIAGNOSIS — Z880 Allergy status to penicillin: Secondary | ICD-10-CM | POA: Diagnosis not present

## 2019-04-02 DIAGNOSIS — M545 Low back pain: Secondary | ICD-10-CM | POA: Diagnosis present

## 2019-04-02 DIAGNOSIS — R1031 Right lower quadrant pain: Secondary | ICD-10-CM | POA: Diagnosis not present

## 2019-04-02 LAB — URINALYSIS, ROUTINE W REFLEX MICROSCOPIC
Bilirubin Urine: NEGATIVE
Glucose, UA: NEGATIVE mg/dL
Ketones, ur: NEGATIVE mg/dL
Leukocytes,Ua: NEGATIVE
Nitrite: NEGATIVE
Protein, ur: NEGATIVE mg/dL
Specific Gravity, Urine: 1.005 (ref 1.005–1.030)
pH: 7 (ref 5.0–8.0)

## 2019-04-02 MED ORDER — CYCLOBENZAPRINE HCL 10 MG PO TABS: 10.0000 mg | ORAL_TABLET | Freq: Three times a day (TID) | ORAL | 2 refills | Status: DC | PRN

## 2019-04-02 MED ORDER — CYCLOBENZAPRINE HCL 5 MG PO TABS
5.0000 mg | ORAL_TABLET | Freq: Once | ORAL | Status: AC
  Administered 2019-04-02: 5 mg via ORAL
  Filled 2019-04-02: qty 1

## 2019-04-02 NOTE — Discharge Instructions (Signed)
Lumbar Strain A lumbar strain, which is sometimes called a low-back strain, is a stretch or tear in a muscle or the strong cords of tissue that attach muscle to bone (tendons) in the lower back (lumbar spine). This type of injury occurs when muscles or tendons are torn or are stretched beyond their limits. Lumbar strains can range from mild to severe. Mild strains may involve stretching a muscle or tendon without tearing it. These may heal in 1-2 weeks. More severe strains involve tearing of muscle fibers or tendons. These will cause more pain and may take 6-8 weeks to heal. What are the causes? This condition may be caused by:  Trauma, such as a fall or a hit to the body.  Twisting or overstretching the back. This may result from doing activities that need a lot of energy, such as lifting heavy objects. What increases the risk? This injury is more common in:  Athletes.  People with obesity.  People who do repeated lifting, bending, or other movements that involve their back. What are the signs or symptoms? Symptoms of this condition may include:  Sharp or dull pain in the lower back that does not go away. The pain may extend to the buttocks.  Stiffness or limited range of motion.  Sudden muscle tightening (spasms). How is this diagnosed? This condition may be diagnosed based on:  Your symptoms.  Your medical history.  A physical exam.  Imaging tests, such as: ? X-rays. ? MRI. How is this treated? Treatment for this condition may include:  Rest.  Applying heat and cold to the affected area.  Over-the-counter medicines to help relieve pain and inflammation, such as NSAIDs.  Prescription pain medicine and muscle relaxants may be needed for a short time.  Physical therapy. Follow these instructions at home: Managing pain, stiffness, and swelling      If directed, put ice on the injured area during the first 24 hours after your injury. ? Put ice in a plastic  bag. ? Place a towel between your skin and the bag. ? Leave the ice on for 20 minutes, 2-3 times a day.  If directed, apply heat to the affected area as often as told by your health care provider. Use the heat source that your health care provider recommends, such as a moist heat pack or a heating pad. ? Place a towel between your skin and the heat source. ? Leave the heat on for 20-30 minutes. ? Remove the heat if your skin turns bright red. This is especially important if you are unable to feel pain, heat, or cold. You may have a greater risk of getting burned. Activity  Rest and return to your normal activities as told by your health care provider. Ask your health care provider what activities are safe for you.  Do exercises as told by your health care provider. Medicines  Take over-the-counter and prescription medicines only as told by your health care provider.  Ask your health care provider if the medicine prescribed to you: ? Requires you to avoid driving or using heavy machinery. ? Can cause constipation. You may need to take these actions to prevent or treat constipation:  Drink enough fluid to keep your urine pale yellow.  Take over-the-counter or prescription medicines.  Eat foods that are high in fiber, such as beans, whole grains, and fresh fruits and vegetables.  Limit foods that are high in fat and processed sugars, such as fried or sweet foods. Injury prevention To prevent   a future low-back injury:  Always warm up properly before physical activity or sports.  Cool down and stretch after being active.  Use correct form when playing sports and lifting heavy objects. Bend your knees before you lift heavy objects.  Use good posture when sitting and standing.  Stay physically fit and keep a healthy weight. ? Do at least 150 minutes of moderate-intensity exercise each week, such as brisk walking or water aerobics. ? Do strength exercises at least 2 times each  week.  General instructions  Do not use any products that contain nicotine or tobacco, such as cigarettes, e-cigarettes, and chewing tobacco. If you need help quitting, ask your health care provider.  Keep all follow-up visits as told by your health care provider. This is important. Contact a health care provider if:  Your back pain does not improve after 6 weeks of treatment.  Your symptoms get worse. Get help right away if:  Your back pain is severe.  You are unable to stand or walk.  You develop pain in your legs.  You develop weakness in your buttocks or legs.  You have difficulty controlling when you urinate or when you have a bowel movement. ? You have frequent, painful, or bloody urination. ? You have a temperature over 101.22F (38.3C) Summary  A lumbar strain, which is sometimes called a low-back strain, is a stretch or tear in a muscle or the strong cords of tissue that attach muscle to bone (tendons) in the lower back (lumbar spine).  This type of injury occurs when muscles or tendons are torn or are stretched beyond their limits.  Rest and return to your normal activities as told by your health care provider. If directed, apply heat and ice to the affected area as often as told by your health care provider.  Take over-the-counter and prescription medicines only as told by your health care provider.  Contact a health care provider if you have new or worsening symptoms. This information is not intended to replace advice given to you by your health care provider. Make sure you discuss any questions you have with your health care provider. Document Revised: 11/06/2017 Document Reviewed: 11/06/2017 Elsevier Patient Education  [place] of Pregnancy The second trimester is from week 14 through week 27 (months 4 through 6). The second trimester is often a time when you feel your best. Your body has adjusted to being pregnant, and you begin to  feel better physically. Usually, morning sickness has lessened or quit completely, you may have more energy, and you may have an increase in appetite. The second trimester is also a time when the fetus is growing rapidly. At the end of the sixth month, the fetus is about 9 inches long and weighs about 1 pounds. You will likely begin to feel the baby move (quickening) between 16 and 20 weeks of pregnancy. Body changes during your second trimester Your body continues to go through many changes during your second trimester. The changes vary from woman to woman.  Your weight will continue to increase. You will notice your lower abdomen bulging out.  You may begin to get stretch marks on your hips, abdomen, and breasts.  You may develop headaches that can be relieved by medicines. The medicines should be approved by your health care provider.  You may urinate more often because the fetus is pressing on your bladder.  You may develop or continue to have heartburn as a result of your  pregnancy.  You may develop constipation because certain hormones are causing the muscles that push waste through your intestines to slow down.  You may develop hemorrhoids or swollen, bulging veins (varicose veins).  You may have back pain. This is caused by: ? Weight gain. ? Pregnancy hormones that are relaxing the joints in your pelvis. ? A shift in weight and the muscles that support your balance.  Your breasts will continue to grow and they will continue to become tender.  Your gums may bleed and may be sensitive to brushing and flossing.  Dark spots or blotches (chloasma, mask of pregnancy) may develop on your face. This will likely fade after the baby is born.  A dark line from your belly button to the pubic area (linea nigra) may appear. This will likely fade after the baby is born.  You may have changes in your hair. These can include thickening of your hair, rapid growth, and changes in texture. Some  women also have hair loss during or after pregnancy, or hair that feels dry or thin. Your hair will most likely return to normal after your baby is born. What to expect at prenatal visits During a routine prenatal visit:  You will be weighed to make sure you and the fetus are growing normally.  Your blood pressure will be taken.  Your abdomen will be measured to track your baby's growth.  The fetal heartbeat will be listened to.  Any test results from the previous visit will be discussed. Your health care provider may ask you:  How you are feeling.  If you are feeling the baby move.  If you have had any abnormal symptoms, such as leaking fluid, bleeding, severe headaches, or abdominal cramping.  If you are using any tobacco products, including cigarettes, chewing tobacco, and electronic cigarettes.  If you have any questions. Other tests that may be performed during your second trimester include:  Blood tests that check for: ? Low iron levels (anemia). ? High blood sugar that affects pregnant women (gestational diabetes) between 92 and 28 weeks. ? Rh antibodies. This is to check for a protein on red blood cells (Rh factor).  Urine tests to check for infections, diabetes, or protein in the urine.  An ultrasound to confirm the proper growth and development of the baby.  An amniocentesis to check for possible genetic problems.  Fetal screens for spina bifida and Down syndrome.  HIV (human immunodeficiency virus) testing. Routine prenatal testing includes screening for HIV, unless you choose not to have this test. Follow these instructions at home: Medicines  Follow your health care provider's instructions regarding medicine use. Specific medicines may be either safe or unsafe to take during pregnancy.  Take a prenatal vitamin that contains at least 600 micrograms (mcg) of folic acid.  If you develop constipation, try taking a stool softener if your health care provider  approves. Eating and drinking   Eat a balanced diet that includes fresh fruits and vegetables, whole grains, good sources of protein such as meat, eggs, or tofu, and low-fat dairy. Your health care provider will help you determine the amount of weight gain that is right for you.  Avoid raw meat and uncooked cheese. These carry germs that can cause birth defects in the baby.  If you have low calcium intake from food, talk to your health care provider about whether you should take a daily calcium supplement.  Limit foods that are high in fat and processed sugars, such as fried  and sweet foods.  To prevent constipation: ? Drink enough fluid to keep your urine clear or pale yellow. ? Eat foods that are high in fiber, such as fresh fruits and vegetables, whole grains, and beans. Activity  Exercise only as directed by your health care provider. Most women can continue their usual exercise routine during pregnancy. Try to exercise for 30 minutes at least 5 days a week. Stop exercising if you experience uterine contractions.  Avoid heavy lifting, wear low heel shoes, and practice good posture.  A sexual relationship may be continued unless your health care provider directs you otherwise. Relieving pain and discomfort  Wear a good support bra to prevent discomfort from breast tenderness.  Take warm sitz baths to soothe any pain or discomfort caused by hemorrhoids. Use hemorrhoid cream if your health care provider approves.  Rest with your legs elevated if you have leg cramps or low back pain.  If you develop varicose veins, wear support hose. Elevate your feet for 15 minutes, 3-4 times a day. Limit salt in your diet. Prenatal Care  Write down your questions. Take them to your prenatal visits.  Keep all your prenatal visits as told by your health care provider. This is important. Safety  Wear your seat belt at all times when driving.  Make a list of emergency phone numbers, including  numbers for family, friends, the hospital, and police and fire departments. General instructions  Ask your health care provider for a referral to a local prenatal education class. Begin classes no later than the beginning of month 6 of your pregnancy.  Ask for help if you have counseling or nutritional needs during pregnancy. Your health care provider can offer advice or refer you to specialists for help with various needs.  Do not use hot tubs, steam rooms, or saunas.  Do not douche or use tampons or scented sanitary pads.  Do not cross your legs for long periods of time.  Avoid cat litter boxes and soil used by cats. These carry germs that can cause birth defects in the baby and possibly loss of the fetus by miscarriage or stillbirth.  Avoid all smoking, herbs, alcohol, and unprescribed drugs. Chemicals in these products can affect the formation and growth of the baby.  Do not use any products that contain nicotine or tobacco, such as cigarettes and e-cigarettes. If you need help quitting, ask your health care provider.  Visit your dentist if you have not gone yet during your pregnancy. Use a soft toothbrush to brush your teeth and be gentle when you floss. Contact a health care provider if:  You have dizziness.  You have mild pelvic cramps, pelvic pressure, or nagging pain in the abdominal area.  You have persistent nausea, vomiting, or diarrhea.  You have a bad smelling vaginal discharge.  You have pain when you urinate. Get help right away if:  You have a fever.  You are leaking fluid from your vagina.  You have spotting or bleeding from your vagina.  You have severe abdominal cramping or pain.  You have rapid weight gain or weight loss.  You have shortness of breath with chest pain.  You notice sudden or extreme swelling of your face, hands, ankles, feet, or legs.  You have not felt your baby move in over an hour.  You have severe headaches that do not go away  when you take medicine.  You have vision changes. Summary  The second trimester is from week 14 through week  27 (months 4 through 6). It is also a time when the fetus is growing rapidly.  Your body goes through many changes during pregnancy. The changes vary from woman to woman.  Avoid all smoking, herbs, alcohol, and unprescribed drugs. These chemicals affect the formation and growth your baby.  Do not use any tobacco products, such as cigarettes, chewing tobacco, and e-cigarettes. If you need help quitting, ask your health care provider.  Contact your health care provider if you have any questions. Keep all prenatal visits as told by your health care provider. This is important. This information is not intended to replace advice given to you by your health care provider. Make sure you discuss any questions you have with your health care provider. Document Revised: 05/01/2018 Document Reviewed: 02/13/2016 Elsevier Patient Education  [employer].

## 2019-04-02 NOTE — MAU Provider Note (Signed)
Chief Complaint:  No chief complaint on file.   First Provider Initiated Contact with Patient 04/02/19 0001     HPI: Tracy Combs is a 34 y.o. G1P0 at 8w1dwho presents to maternity admissions reporting low back pain and some radiation of pain to her right lower abdomen.  This occurred after struggling with a large 115lb dog at work today.  Did not go away with tylenol so came in.  Has not felt fetal movement yet this pregnancy. She reports good fetal movement, denies LOF, vaginal bleeding, vaginal itching/burning, urinary symptoms, h/a, dizziness, n/v, diarrhea, constipation or fever/chills.  Back Pain This is a new problem. The current episode started today. The problem occurs constantly. The problem is unchanged. The pain is present in the lumbar spine and sacro-iliac. The quality of the pain is described as aching. Radiates to: right lower abdomen. The pain is the same all the time. The symptoms are aggravated by sitting. Associated symptoms include abdominal pain and pelvic pain (mild RLQ pain ). Pertinent negatives include no dysuria, fever, headaches, leg pain, numbness, paresthesias, tingling or weakness. Treatments tried: Tylenol. The treatment provided no relief.    RN Note: PT SAYS THINKS SHE PULLED MUSCLE IN BACK- AT NOON  SHE'S A VET.  TONIGHT AT 630PM- FELT LOWER ABD PAIN.  DRANK WATER , TOOK TYLENOL AT 1 PM.   Past Medical History: Past Medical History:  Diagnosis Date  . Back pain     Past obstetric history: OB History  Gravida Para Term Preterm AB Living  1            SAB TAB Ectopic Multiple Live Births               # Outcome Date GA Lbr Len/2nd Weight Sex Delivery Anes PTL Lv  1 Current             Past Surgical History: Past Surgical History:  Procedure Laterality Date  . NO PAST SURGERIES      Family History: Family History  Problem Relation Age of Onset  . Diabetes Mother   . Cancer Sister 33       half sister- ovarian  . Stroke Neg Hx   .  Hypertension Neg Hx     Social History: Social History   Tobacco Use  . Smoking status: Never Smoker  . Smokeless tobacco: Never Used  Substance Use Topics  . Alcohol use: Not Currently  . Drug use: Never    Allergies:  Allergies  Allergen Reactions  . Amoxicillin-Pot Clavulanate Rash    Meds:  Medications Prior to Admission  Medication Sig Dispense Refill Last Dose  . cetirizine (ZYRTEC) 10 MG tablet Take 10 mg by mouth daily.   04/01/2019 at Unknown time  . Prenatal Vit-Fe Fumarate-FA (MULTIVITAMIN-PRENATAL) 27-0.8 MG TABS tablet Take 1 tablet by mouth daily at 12 noon.   04/01/2019 at Unknown time    I have reviewed patient's Past Medical Hx, Surgical Hx, Family Hx, Social Hx, medications and allergies.   ROS:  Review of Systems  Constitutional: Negative for fever.  Gastrointestinal: Positive for abdominal pain.  Genitourinary: Positive for pelvic pain (mild RLQ pain ). Negative for dysuria.  Musculoskeletal: Positive for back pain.  Neurological: Negative for tingling, weakness, numbness, headaches and paresthesias.   Other systems negative  Physical Exam   Patient Vitals for the past 24 hrs:  BP Temp Temp src Pulse Resp Height Weight  04/01/19 2338 122/77 98.5 F (36.9 C) Oral 88 20 5'  1" (1.549 m) 73.3 kg   Vitals:   04/01/19 2338 04/02/19 0006 04/02/19 0126  BP: 122/77 129/73 (!) 112/50  Pulse: 88 90 89  Resp: 20    Temp: 98.5 F (36.9 C)    TempSrc: Oral    Weight: 73.3 kg    Height: 5\' 1"  (1.549 m)      Constitutional: Well-developed, well-nourished female in no acute distress.  Cardiovascular: normal rate and rhythm Respiratory: normal effort, clear to auscultation bilaterally GI: Abd soft, non-tender, gravid appropriate for gestational age.   No rebound or guarding. Has RLQ pain but is not tender to palpation.  Negative murphys sign.   MS: Extremities nontender, no edema, normal ROM  Sacral pain, but not tender to palpation Neurologic: Alert and  oriented x 4.  GU: Neg CVAT.  PELVIC EXAM: Deferred  FHT:  145   Labs: Results for orders placed or performed during the hospital encounter of 04/01/19 (from the past 24 hour(s))  Urinalysis, Routine w reflex microscopic     Status: Abnormal   Collection Time: 04/01/19 11:32 PM  Result Value Ref Range   Color, Urine STRAW (A) YELLOW   APPearance CLEAR CLEAR   Specific Gravity, Urine 1.005 1.005 - 1.030   pH 7.0 5.0 - 8.0   Glucose, UA NEGATIVE NEGATIVE mg/dL   Hgb urine dipstick SMALL (A) NEGATIVE   Bilirubin Urine NEGATIVE NEGATIVE   Ketones, ur NEGATIVE NEGATIVE mg/dL   Protein, ur NEGATIVE NEGATIVE mg/dL   Nitrite NEGATIVE NEGATIVE   Leukocytes,Ua NEGATIVE NEGATIVE   RBC / HPF 0-5 0 - 5 RBC/hpf   WBC, UA 0-5 0 - 5 WBC/hpf   Bacteria, UA RARE (A) NONE SEEN   Squamous Epithelial / LPF 0-5 0 - 5   Hyaline Casts, UA PRESENT     Imaging:   MAU Course/MDM: I have ordered labs and reviewed results. UA is clear.   Will give a small dose of Flexeril to see if there is improvement in pain She did get some relief from Flexeril Discussed she can use it at home and Tylenol for pain. May use 2-3 doses of ibuprofen for back pain over next 2 days.  Ice to back  Assessment: Single intrauterine pregnancy at [redacted]w[redacted]d Low back strain Right lower abdominal pain radiating from back pain, likely strain of round ligament  Plan: Discharge home Ice to back Rx Flexeril for prn use for back pain Labor precautions and fetal kick counts Follow up in Office for prenatal visits   Encouraged to return here or to other Urgent Care/ED if she develops worsening of symptoms, increase in pain, fever, or other concerning symptoms.   Pt stable at time of discharge.  Hansel Feinstein CNM, MSN Certified Nurse-Midwife 04/02/2019 12:01 AM

## 2019-04-03 ENCOUNTER — Inpatient Hospital Stay (HOSPITAL_COMMUNITY)
Admission: AD | Admit: 2019-04-03 | Discharge: 2019-04-03 | Disposition: A | Discharge status: Home / Self Care | Payer: PRIVATE HEALTH INSURANCE | Attending: Obstetrics and Gynecology | Admitting: Obstetrics and Gynecology

## 2019-04-03 ENCOUNTER — Inpatient Hospital Stay (HOSPITAL_BASED_OUTPATIENT_CLINIC_OR_DEPARTMENT_OTHER): Payer: PRIVATE HEALTH INSURANCE

## 2019-04-03 ENCOUNTER — Encounter (HOSPITAL_COMMUNITY): Payer: Self-pay | Admitting: Obstetrics and Gynecology

## 2019-04-03 DIAGNOSIS — O3412 Maternal care for benign tumor of corpus uteri, second trimester: Secondary | ICD-10-CM | POA: Insufficient documentation

## 2019-04-03 DIAGNOSIS — Z3A20 20 weeks gestation of pregnancy: Secondary | ICD-10-CM

## 2019-04-03 DIAGNOSIS — D259 Leiomyoma of uterus, unspecified: Secondary | ICD-10-CM

## 2019-04-03 DIAGNOSIS — R1030 Lower abdominal pain, unspecified: Secondary | ICD-10-CM | POA: Insufficient documentation

## 2019-04-03 DIAGNOSIS — O26899 Other specified pregnancy related conditions, unspecified trimester: Secondary | ICD-10-CM | POA: Diagnosis not present

## 2019-04-03 DIAGNOSIS — R102 Pelvic and perineal pain: Secondary | ICD-10-CM

## 2019-04-03 DIAGNOSIS — R109 Unspecified abdominal pain: Secondary | ICD-10-CM

## 2019-04-03 DIAGNOSIS — Z880 Allergy status to penicillin: Secondary | ICD-10-CM | POA: Diagnosis not present

## 2019-04-03 DIAGNOSIS — O3410 Maternal care for benign tumor of corpus uteri, unspecified trimester: Secondary | ICD-10-CM | POA: Diagnosis not present

## 2019-04-03 DIAGNOSIS — O26892 Other specified pregnancy related conditions, second trimester: Secondary | ICD-10-CM | POA: Diagnosis not present

## 2019-04-03 MED ORDER — COMFORT FIT MATERNITY SUPP LG MISC: 1.0000 [IU] | Freq: Every day | 0 refills | Status: DC

## 2019-04-03 MED ORDER — OXYCODONE-ACETAMINOPHEN 5-325 MG PO TABS
2.0000 | ORAL_TABLET | Freq: Once | ORAL | Status: AC
  Administered 2019-04-03: 2 via ORAL
  Filled 2019-04-03: qty 2

## 2019-04-03 NOTE — MAU Provider Note (Signed)
History     CSN: [id number]  Arrival date and time: 04/03/19 0102   First Provider Initiated Contact with Patient 04/03/19 0134      Chief Complaint  Patient presents with  . Abdominal Pain   Tracy Combs is a 34 y.o. G1P0 at [redacted]w[redacted]d who presents to MAU with complaints of abdominal pain. Patient was seen in MAU on 3/11 for abdominal pain and suspected pulled muscle, patient was diagnosed with RLP and discharged with flexeril. Patient reports lower abdominal pain in pelvis that is sharp stabbing and constant. Rates pain 6/10- has tried Flexeril last night around 2000, pain got better then worse again around 2300. Since then the pain has not gotten better. Patient reports pain gets worse with any movement, laying down, standing, sitting in chair. She denies vaginal bleeding, discharge or LOF. Patient reports that she has yet to feel fetal movement during this pregnancy.    OB History    Gravida  1   Para      Term      Preterm      AB      Living        SAB      TAB      Ectopic      Multiple      Live Births              Past Medical History:  Diagnosis Date  . Back pain     Past Surgical History:  Procedure Laterality Date  . NO PAST SURGERIES      Family History  Problem Relation Age of Onset  . Diabetes Mother   . Cancer Sister 62       half sister- ovarian  . Stroke Neg Hx   . Hypertension Neg Hx     Social History   Tobacco Use  . Smoking status: Never Smoker  . Smokeless tobacco: Never Used  Substance Use Topics  . Alcohol use: Not Currently  . Drug use: Never    Allergies:  Allergies  Allergen Reactions  . Amoxicillin-Pot Clavulanate Rash    Medications Prior to Admission  Medication Sig Dispense Refill Last Dose  . acetaminophen (TYLENOL) 500 MG tablet Take 500 mg by mouth every 6 (six) hours as needed.   04/02/2019 at Unknown time  . cetirizine (ZYRTEC) 10 MG tablet Take 10 mg by mouth daily.   04/02/2019 at Unknown time  .  cyclobenzaprine (FLEXERIL) 10 MG tablet Take 1 tablet (10 mg total) by mouth every 8 (eight) hours as needed for muscle spasms. 30 tablet 2 04/02/2019 at Unknown time  . Prenatal Vit-Fe Fumarate-FA (MULTIVITAMIN-PRENATAL) 27-0.8 MG TABS tablet Take 1 tablet by mouth daily at 12 noon.   04/02/2019 at Unknown time    Review of Systems  Constitutional: Negative.   Respiratory: Negative.   Cardiovascular: Negative.   Gastrointestinal: Positive for abdominal pain. Negative for constipation, diarrhea, nausea and vomiting.  Genitourinary: Negative.   Musculoskeletal: Negative.   Neurological: Negative.   Psychiatric/Behavioral: Negative.    Physical Exam   Pulse (!) 102, temperature 98.5 F (36.9 C), temperature source Oral, resp. rate 18, height 5\' 1"  (1.549 m), weight 73.1 kg, last menstrual period 11/12/2018, SpO2 99 %.  Physical Exam  Nursing note and vitals reviewed. Constitutional: She is oriented to person, place, and time. She appears well-developed and well-nourished. She appears distressed.  Patient is tearful and anxious in room   Cardiovascular: Normal rate and regular rhythm.  Respiratory: Effort  normal and breath sounds normal. No respiratory distress. She has no wheezes.  GI: Soft. Bowel sounds are normal. There is abdominal tenderness. There is no rebound and no guarding.  Gravid appropriate for gestational age, tenderness with palpation suprapubic, RLQ, and LLQ   Musculoskeletal:        General: No edema. Normal range of motion.  Neurological: She is alert and oriented to person, place, and time.  Psychiatric: She has a normal mood and affect. Her behavior is normal. Thought content normal.   FHR 145 by doppler   Dilation: Closed Effacement (%): Thick Cervical Position: Anterior Exam by:: Wende Bushy CNM  MAU Course  Procedures  MDM Orders Placed This Encounter  Procedures  . [place] MFM OB LIMITED  . Urinalysis, Routine w reflex microscopic   Meds ordered this  encounter  Medications  . oxyCODONE-acetaminophen (PERCOCET/ROXICET) 5-325 MG per tablet 2 tablet  . Elastic Bandages & Supports (COMFORT FIT MATERNITY SUPP LG) MISC    Sig: 1 Units by Does not apply route daily.    Dispense:  1 each    Refill:  0    Order Specific Question:   Supervising Provider    Answer:   Jonnie Kind [[id number]]   Treatments in MAU included percocet for pain management prior to [place] to assess fetus and uterine fibroid.   [place] report reviewed: Cephalic presentation, anterior placenta, FHR 144, AFV WNL, CL 3.87cm  - On [place] reports RT LUS Myoma is 9.6x3.4x7.2cm which is larger than on previous US report  Reassessment of pain after [place]- patient reports pain is down to 3/10. Discussed with patient d/t anterior placenta fetal movement can be felt at later [place].   Educated and discussed results of [place] with patient, discussed that fibroid is larger which can cause increased pain. Discussed with patient pain management at home for fibroid pain and RLP with flexeril and Tylenol. Discussed use of maternity support belt, Rx for maternity support belt given to patient with location to take prescription to.   Discussed reasons to return to MAU. Follow up as scheduled in the office. Return to MAU as needed. Pt stable at time of discharge.  Assessment and Plan   1. Abdominal pain during pregnancy   2. Uterine fibroid during pregnancy, antepartum   3. Pain of round ligament during pregnancy   4. [redacted] weeks gestation of pregnancy    Discharge home Follow up as scheduled in the office for prenatal care Return to MAU as needed for reasons discussed and/or emergencies  Rx for maternity support belt  Continue medications as prescribed for home use  Hydration and rest    [facility] [place] Follow up.   Specialty: Obstetrics and Gynecology Why: Follow up as scheduled for prenatal care and return to MAU as needed  Contact  information: [address] [place] [phone]         Allergies as of 04/03/2019      Reactions   Amoxicillin-pot Clavulanate Rash      Medication List    TAKE these medications   acetaminophen 500 MG tablet Commonly known as: TYLENOL Take 500 mg by mouth every 6 (six) hours as needed.   cetirizine 10 MG tablet Commonly known as: ZYRTEC Take 10 mg by mouth daily.   Comfort Fit Maternity Supp Lg Misc 1 Units by Does not apply route daily.   cyclobenzaprine 10 MG tablet Commonly  known as: FLEXERIL Take 1 tablet (10 mg total) by mouth every 8 (eight) hours as needed for muscle spasms.   multivitamin-prenatal 27-0.8 MG Tabs tablet Take 1 tablet by mouth daily at 12 noon.       Lajean Manes CNM 04/03/2019, 2:56 AM

## 2019-04-03 NOTE — MAU Note (Signed)
..  Tracy Combs is a 34 y.o. at [redacted]w[redacted]d here in MAU reporting: lower abdominal pain 6/10. Pt states she was seen here last night and was given flexeril she found relief but the pain came last night at 2300 and would not go away. Pt denies vaginal or LOF. +FM.   FHT: 145 with doppler

## 2019-04-03 NOTE — Discharge Instructions (Signed)
° °PREGNANCY SUPPORT BELT: °You are not alone, Seventy-five percent of women have some sort of abdominal or back pain at some point in their pregnancy. Your baby is growing at a fast pace, which means that your whole body is rapidly trying to adjust to the changes. As your uterus grows, your back may start feeling a bit under stress and this can result in back or abdominal pain that can go from mild, and therefore bearable, to severe pains that will not allow you to sit or lay down comfortably, When it comes to dealing with pregnancy-related pains and cramps, some pregnant women usually prefer natural remedies, which the market is filled with nowadays. For example, wearing a pregnancy support belt can help ease and lessen your discomfort and pain. °WHAT ARE THE BENEFITS OF WEARING A PREGNANCY SUPPORT BELT? A pregnancy support belt provides support to the lower portion of the belly taking some of the weight of the growing uterus and distributing to the other parts of your body. It is designed make you comfortable and gives you extra support. Over the years, the pregnancy apparel market has been studying the needs and wants of pregnant women and they have come up with the most comfortable pregnancy support belts that woman could ever ask for. In fact, you will no longer have to wear a stretched-out or bulky pregnancy belt that is visible underneath your clothes and makes you feel even more uncomfortable. Nowadays, a pregnancy support belt is made of comfortable and stretchy materials that will not irritate your skin but will actually make you feel at ease and you will not even notice you are wearing it. They are easy to put on and adjust during the day and can be worn at night for additional support.  °BENEFITS: °• Relives Back pain °• Relieves Abdominal Muscle and Leg Pain °• Stabilizes the Pelvic Ring °• Offers a Cushioned Abdominal Lift Pad °• Relieves pressure on the Sciatic Nerve Within Minutes °WHERE TO GET  YOUR PREGNANCY BELT: [employer] [phone] @[address] [place], [place] [address] ° ° °Abdominal Pain During Pregnancy ° °Belly (abdominal) pain is common during pregnancy. There are many possible causes. Most of the time, it is not a serious problem. Other times, it can be a sign that something is wrong with the pregnancy. Always tell your doctor if you have belly pain. °Follow these instructions at home: °· Do not have sex or put anything in your vagina until your pain goes away completely. °· Get plenty of rest until your pain gets better. °· Drink enough fluid to keep your pee (urine) pale yellow. °· Take over-the-counter and prescription medicines only as told by your doctor. °· Keep all follow-up visits as told by your doctor. This is important. °Contact a doctor if: °· Your pain continues or gets worse after resting. °· You have lower belly pain that: °? Comes and goes at regular times. °? Spreads to your back. °? Feels like menstrual cramps. °· You have pain or burning when you pee (urinate). °Get help right away if: °· You have a fever or chills. °· You have vaginal bleeding. °· You are leaking fluid from your vagina. °· You are passing tissue from your vagina. °· You throw up (vomit) for more than 24 hours. °· You have watery poop (diarrhea) for more than 24 hours. °· Your baby is moving less than usual. °· You feel very weak or faint. °· You have shortness of breath. °·   You have very bad pain in your upper belly. °Summary °· Belly (abdominal) pain is common during pregnancy. There are many possible causes. °· If you have belly pain during pregnancy, tell your doctor right away. °· Keep all follow-up visits as told by your doctor. This is important. °This information is not intended to replace advice given to you by your health care provider. Make sure you discuss any questions you have with your health care provider. °Document Revised: 04/27/2018 Document Reviewed:  04/11/2016 °Elsevier Patient Education © 2020 Elsevier Inc[employer] ° °

## 2019-04-19 ENCOUNTER — Other Ambulatory Visit (HOSPITAL_COMMUNITY): Payer: Self-pay | Admitting: *Deleted

## 2019-04-19 ENCOUNTER — Other Ambulatory Visit: Payer: Self-pay

## 2019-04-19 ENCOUNTER — Ambulatory Visit (HOSPITAL_COMMUNITY): Payer: PRIVATE HEALTH INSURANCE | Admitting: *Deleted

## 2019-04-19 ENCOUNTER — Encounter (HOSPITAL_COMMUNITY): Payer: Self-pay | Admitting: *Deleted

## 2019-04-19 ENCOUNTER — Ambulatory Visit (HOSPITAL_COMMUNITY)
Admission: RE | Admit: 2019-04-19 | Discharge: 2019-04-19 | Disposition: A | Discharge status: Home / Self Care | Payer: PRIVATE HEALTH INSURANCE | Source: Ambulatory Visit | Attending: Obstetrics and Gynecology | Admitting: Obstetrics and Gynecology

## 2019-04-19 DIAGNOSIS — O09512 Supervision of elderly primigravida, second trimester: Secondary | ICD-10-CM

## 2019-04-19 DIAGNOSIS — D259 Leiomyoma of uterus, unspecified: Secondary | ICD-10-CM | POA: Diagnosis not present

## 2019-04-19 DIAGNOSIS — O3412 Maternal care for benign tumor of corpus uteri, second trimester: Secondary | ICD-10-CM

## 2019-04-19 DIAGNOSIS — Z362 Encounter for other antenatal screening follow-up: Secondary | ICD-10-CM | POA: Diagnosis not present

## 2019-04-19 DIAGNOSIS — Z3A22 22 weeks gestation of pregnancy: Secondary | ICD-10-CM

## 2019-04-19 DIAGNOSIS — Z3401 Encounter for supervision of normal first pregnancy, first trimester: Secondary | ICD-10-CM

## 2019-04-19 DIAGNOSIS — O09513 Supervision of elderly primigravida, third trimester: Secondary | ICD-10-CM

## 2019-04-19 DIAGNOSIS — O09522 Supervision of elderly multigravida, second trimester: Secondary | ICD-10-CM | POA: Diagnosis not present

## 2019-04-29 ENCOUNTER — Telehealth (INDEPENDENT_AMBULATORY_CARE_PROVIDER_SITE_OTHER): Payer: PRIVATE HEALTH INSURANCE | Admitting: Family Medicine

## 2019-04-29 VITALS — BP 117/71

## 2019-04-29 DIAGNOSIS — O09512 Supervision of elderly primigravida, second trimester: Secondary | ICD-10-CM

## 2019-04-29 DIAGNOSIS — D259 Leiomyoma of uterus, unspecified: Secondary | ICD-10-CM

## 2019-04-29 DIAGNOSIS — O3413 Maternal care for benign tumor of corpus uteri, third trimester: Secondary | ICD-10-CM

## 2019-04-29 DIAGNOSIS — O09513 Supervision of elderly primigravida, third trimester: Secondary | ICD-10-CM

## 2019-04-29 DIAGNOSIS — Z3A24 24 weeks gestation of pregnancy: Secondary | ICD-10-CM

## 2019-04-29 DIAGNOSIS — Z3401 Encounter for supervision of normal first pregnancy, first trimester: Secondary | ICD-10-CM

## 2019-04-29 NOTE — Progress Notes (Signed)
OBSTETRICS PRENATAL VIRTUAL VISIT ENCOUNTER NOTE  Provider location: [facility]   I connected with Tracy Combs on 04/29/19 at  1:30 PM EDT by MyChart Video Encounter at home and verified that I am speaking with the correct person using two identifiers.   I discussed the limitations, risks, security and privacy concerns of performing an evaluation and management service virtually and the availability of in person appointments. I also discussed with the patient that there may be a patient responsible charge related to this service. The patient expressed understanding and agreed to proceed. Subjective:  Tracy Combs is a 34 y.o. G1P0 at [redacted]w[redacted]d being seen today for ongoing prenatal care.  She is currently monitored for the following issues for this high-risk pregnancy and has Encounter for supervision of normal first pregnancy in first trimester; AMA (advanced maternal age) primigravida 75+; Family history of ovarian cancer; Intervertebral disc disorders with radiculopathy, lumbar region; and Uterine fibroid in pregnancy on their problem list.  Patient reports no complaints.  Contractions: Not present. Vag. Bleeding: None.  Movement: Present. Denies any leaking of fluid.   The following portions of the patient's history were reviewed and updated as appropriate: allergies, current medications, past family history, past medical history, past social history, past surgical history and problem list.   Objective:   Vitals:   04/29/19 1335  BP: 117/71    Fetal Status:     Movement: Present     General:  Alert, oriented and cooperative. Patient is in no acute distress.  Respiratory: Normal respiratory effort, no problems with respiration noted  Mental Status: Normal mood and affect. Normal behavior. Normal judgment and thought content.  Rest of physical exam deferred due to type of encounter  Imaging: [place] MFM OB FOLLOW UP  Result Date:  04/19/2019 ----------------------------------------------------------------------  OBSTETRICS REPORT                       (Signed Final 04/19/2019 09:10 am) ---------------------------------------------------------------------- Patient Info  ID #:       [id number]                          D.O.B.:  February 18, 1985 (33 yrs)  Name:       Tracy Combs                     Visit Date: 04/19/2019 08:02 am ---------------------------------------------------------------------- Performed By  Performed By:     Valda Favia          Ref. Address:     [place]  Attending:        Tama High MD        [facility]  Referred By:      [facility] [place] ---------------------------------------------------------------------- Orders   #  Description                          Code         Ordered By   1  [place] MFM OB FOLLOW UP                  [id number]     Peterson Ao  ----------------------------------------------------------------------   #  Order #                    Accession #                 Episode #   1  [id number]                  [id number]  ---------------------------------------------------------------------- Indications   [redacted] weeks gestation of pregnancy                Z3A.1   Advanced maternal age primigravida 22+,        O45.512   second trimester   Uterine fibroids affecting pregnancy in        O34.12, D25.9   second trimester, antepartum   Low risk NIPS, 7.0FF, AFP neg   Encounter for other antenatal screening        Z36.2   follow-up  ---------------------------------------------------------------------- Fetal Evaluation  Num Of Fetuses:         1  Fetal Heart Rate(bpm):  138  Cardiac Activity:       Observed  Presentation:           Transverse, head to maternal left  Placenta:               Anterior  P. Cord Insertion:       Visualized, central  Amniotic Fluid  AFI FV:      Within normal limits                              Largest Pocket(cm)                              4.2 ---------------------------------------------------------------------- Biometry  BPD:      51.4  mm     G. Age:  21w 4d         13  %    CI:         68.1   %    70 - 86                                                          FL/HC:      18.8   %    19.2 - 20.8  HC:      199.2  mm     G. Age:  22w 1d         19  %    HC/AC:      1.05        1.05 - 1.21  AC:      190.6  mm     G. Age:  23w 6d         80  %    FL/BPD:     72.8   %    71 - 87  FL:       37.4  mm     G. Age:  21w 6d         20  %    FL/AC:      19.6   %    20 - 24  Est. FW:     538  gm      1 lb 3 oz     56  % ---------------------------------------------------------------------- OB History  Gravidity:    1         Term:   0        Prem:   0        SAB:   0  TOP:          0       Ectopic:  0        Living: 0 ---------------------------------------------------------------------- Gestational Age  LMP:           22w 4d        Date:  11/12/18                 EDD:   08/19/19  U/S Today:     22w 3d                                        EDD:   08/20/19  Best:          22w 4d     Det. By:  LMP  (11/12/18)          EDD:   08/19/19 ---------------------------------------------------------------------- Anatomy  Cranium:               Appears normal         LVOT:                   Appears normal  Cavum:                 Previously seen        Aortic Arch:            Appears normal  Ventricles:            Appears normal         Ductal Arch:            Not well visualized  Choroid Plexus:        Resolved CPC           Diaphragm:              Previously seen  Cerebellum:            Previously seen        Stomach:                Appears normal, left  sided  Posterior Fossa:       Previously seen        Abdomen:                Previously seen   Nuchal Fold:           Previously seen        Abdominal Wall:         Appears nml (cord                                                                        insert, abd wall)  Face:                  Orbits and profile     Cord Vessels:           Previously seen                         previously seen  Lips:                  Previously seen        Kidneys:                Previously seen  Palate:                Not well visualized    Bladder:                Appears normal  Thoracic:              Appears normal         Spine:                  Previously seen  Heart:                 Appears normal         Upper Extremities:      Previously seen                         (4CH, axis, and                         situs)  RVOT:                  Appears normal         Lower Extremities:      Previously seen  Other:  Heels visualized previously. Hands not well visualized. Technically          difficult due to fetal position. ---------------------------------------------------------------------- Cervix Uterus Adnexa  Cervix  Length:              5  cm.  Normal appearance by transabdominal scan.  Uterus  Single fibroid noted, see table below.  Left Ovary  No adnexal mass visualized.  Right Ovary  No adnexal mass visualized.  Cul De Sac  No free fluid seen.  Adnexa  No abnormality visualized. ---------------------------------------------------------------------- Myomas   Site                     L(cm)      W(cm)  D(cm)      Location   RT LUS                   8.7        8.6        8.6  ----------------------------------------------------------------------   Blood Flow                 RI        PI       Comments  ---------------------------------------------------------------------- Impression  Patient returned for completion of fetal anatomy. Fetal  biometry is consistent with her previously-established dates.  Amniotic fluid is normal and good fetal activity is seen. Fetal  anatomical survey was completed and appears normal.   Choroid plexus cyst has resolved.  Right lower uterine segment myoma is seen again and it is  essentially unchanged in size. Patient does not have  symptoms pertaining to the myoma. ---------------------------------------------------------------------- Recommendations  -An appointment was made for her to return in 6 weeks for  fetal growth assessment and to evaluate the myoma. ----------------------------------------------------------------------                  Tama High, MD Electronically Signed Final Report   04/19/2019 09:10 am ----------------------------------------------------------------------  [place] MFM OB LIMITED  Result Date: 04/03/2019 ----------------------------------------------------------------------  OBSTETRICS REPORT                       (Signed Final 04/03/2019 07:37 pm) ---------------------------------------------------------------------- Patient Info  ID #:       [id number]                          D.O.B.:  [birth year] (33 yrs)  Name:       Tracy Combs                     Visit Date: 04/03/2019 01:49 am ---------------------------------------------------------------------- Performed By  Performed By:     Corky Crafts             Ref. Address:      [place]  Attending:        Tama High MD        [facility]  Referred By:      [facility] [place] ---------------------------------------------------------------------- Orders   #  Description                          Code         Ordered By   1  [place]  MFM OB LIMITED                    [id number]     Darrol Poke  ----------------------------------------------------------------------   #  Order #                    Accession #                 Episode #   1  [id number]                  [id number]                  [id number]   ---------------------------------------------------------------------- Indications   Abdominal pain in pregnancy                    O99.89   [redacted] weeks gestation of pregnancy                Z3A.17   Advanced maternal age primigravida 31+,        O82.512   second trimester   Uterine fibroids affecting pregnancy in        O34.12, D25.9   second trimester, antepartum  ---------------------------------------------------------------------- Fetal Evaluation  Num Of Fetuses:          1  Fetal Heart Rate(bpm):   144  Cardiac Activity:        Observed  Presentation:            Cephalic  Placenta:                Anterior  Amniotic Fluid  AFI FV:      Within normal limits                              Largest Pocket(cm)                              3.39 ---------------------------------------------------------------------- OB History  Gravidity:    1         Term:   0        Prem:   0        SAB:   0  TOP:          0       Ectopic:  0        Living: 0 ---------------------------------------------------------------------- Gestational Age  LMP:           20w 2d        Date:  11/12/18                 EDD:   08/19/19  Best:          Hyacinth Meeker 2d     Det. By:  LMP  (11/12/18)          EDD:   08/19/19 ---------------------------------------------------------------------- Cervix Uterus Adnexa  Cervix  Length:           3.87  cm.  Normal appearance by transabdominal scan.  Uterus  Single fibroid noted, see table below. ---------------------------------------------------------------------- Myomas   Site                     L(cm)      W(cm)      D(cm)       Location   RT LUS  9.6        3.4        7.2  ----------------------------------------------------------------------   Blood Flow                 RI        PI       Comments  ---------------------------------------------------------------------- Impression  Patient was evaluated for abdominal pain.  A limited ultrasound study was performed. Antenatal testing  is reassuring.  BPP 8/8. On transabdominal scan, the cervix  looks long and closed.  Right lower segment myoma is seen again. Placenta appears  normal. ----------------------------------------------------------------------                  Tama High, MD Electronically Signed Final Report   04/03/2019 07:37 pm ----------------------------------------------------------------------   Assessment and Plan:  Pregnancy: G1P0 at [redacted]w[redacted]d 1. Encounter for supervision of normal first pregnancy in first trimester Good fetal movement. 28 week labs next visit  2. Primigravida of advanced maternal age in second trimester  3. Uterine fibroid in pregnancy stable  Preterm labor symptoms and general obstetric precautions including but not limited to vaginal bleeding, contractions, leaking of fluid and fetal movement were reviewed in detail with the patient. I discussed the assessment and treatment plan with the patient. The patient was provided an opportunity to ask questions and all were answered. The patient agreed with the plan and demonstrated an understanding of the instructions. The patient was advised to call back or seek an in-person office evaluation/go to MAU at [facility] for any urgent or concerning symptoms. Please refer to After Visit Summary for other counseling recommendations.   I provided 10 minutes of face-to-face time during this encounter.  Return in about 4 weeks (around 05/27/2019) for OB f/u, 2 hr GTT, In Office.  Future Appointments  Date Time Provider [place]  05/31/2019  8:00 AM [place] [facility]  05/31/2019  8:00 AM [facility] [place] 3 [facility]-US    Deontra Pereyra J Jermine Bibbee, DO [facility] [place]

## 2019-05-25 ENCOUNTER — Encounter: Payer: Self-pay | Admitting: Medical

## 2019-05-25 ENCOUNTER — Other Ambulatory Visit: Payer: Self-pay

## 2019-05-25 ENCOUNTER — Ambulatory Visit (INDEPENDENT_AMBULATORY_CARE_PROVIDER_SITE_OTHER): Payer: PRIVATE HEALTH INSURANCE | Admitting: Medical

## 2019-05-25 VITALS — BP 124/70 | HR 87 | Wt 170.0 lb

## 2019-05-25 DIAGNOSIS — D259 Leiomyoma of uterus, unspecified: Secondary | ICD-10-CM

## 2019-05-25 DIAGNOSIS — O3410 Maternal care for benign tumor of corpus uteri, unspecified trimester: Secondary | ICD-10-CM

## 2019-05-25 DIAGNOSIS — Z3A27 27 weeks gestation of pregnancy: Secondary | ICD-10-CM

## 2019-05-25 DIAGNOSIS — Z23 Encounter for immunization: Secondary | ICD-10-CM

## 2019-05-25 DIAGNOSIS — O3412 Maternal care for benign tumor of corpus uteri, second trimester: Secondary | ICD-10-CM

## 2019-05-25 DIAGNOSIS — O09512 Supervision of elderly primigravida, second trimester: Secondary | ICD-10-CM

## 2019-05-25 DIAGNOSIS — Z3401 Encounter for supervision of normal first pregnancy, first trimester: Secondary | ICD-10-CM

## 2019-05-25 NOTE — Progress Notes (Signed)
   PRENATAL VISIT NOTE  Subjective:  Tracy Combs is a 34 y.o. G1P0 at [redacted]w[redacted]d being seen today for ongoing prenatal care.  She is currently monitored for the following issues for this high-risk pregnancy and has Encounter for supervision of normal first pregnancy in first trimester; AMA (advanced maternal age) primigravida 59+; Family history of ovarian cancer; Intervertebral disc disorders with radiculopathy, lumbar region; and Uterine fibroid in pregnancy on their problem list.  Patient reports no complaints.  Contractions: Not present. Vag. Bleeding: None.  Movement: Present. Denies leaking of fluid.   The following portions of the patient's history were reviewed and updated as appropriate: allergies, current medications, past family history, past medical history, past social history, past surgical history and problem list.   Objective:   Vitals:   05/25/19 0855  BP: 124/70  Pulse: 87  Weight: 170 lb (77.1 kg)    Fetal Status: Fetal Heart Rate (bpm): 140 Fundal Height: 29 cm Movement: Present     General:  Alert, oriented and cooperative. Patient is in no acute distress.  Skin: Skin is warm and dry. No rash noted.   Cardiovascular: Normal heart rate noted  Respiratory: Normal respiratory effort, no problems with respiration noted  Abdomen: Soft, gravid, appropriate for gestational age.  Pain/Pressure: Absent     Pelvic: Cervical exam deferred        Extremities: Normal range of motion.  Edema: None  Mental Status: Normal mood and affect. Normal behavior. Normal judgment and thought content.   Assessment and Plan:  Pregnancy: G1P0 at [redacted]w[redacted]d 1. Encounter for supervision of normal first pregnancy in first trimester - Glucose Tolerance, 2 Hours w/1 Hour - HIV antibody (with reflex) - RPR - CBC - TDap given today  - Peds list given   2. Uterine fibroid in pregnancy - Follow-up growth [place] 5/10  3. Primigravida of advanced maternal age in second trimester - Will be 34 yo at  delivery   Preterm labor symptoms and general obstetric precautions including but not limited to vaginal bleeding, contractions, leaking of fluid and fetal movement were reviewed in detail with the patient. Please refer to After Visit Summary for other counseling recommendations.   Return in about 2 weeks (around 06/08/2019) for LOB, Virtual.  Future Appointments  Date Time Provider [place]  05/31/2019  8:00 AM [facility] NURSE [facility]  05/31/2019  8:00 AM [facility] US3 [facility] [place]    Kerry Hough, PA-C

## 2019-05-25 NOTE — Patient Instructions (Addendum)
Fetal Movement Counts Patient Name: ________________________________________________ Patient Due Date: ____________________ What is a fetal movement count?  A fetal movement count is the number of times that you feel your baby move during a certain amount of time. This may also be called a fetal kick count. A fetal movement count is recommended for every pregnant woman. You may be asked to start counting fetal movements as early as week 28 of your pregnancy. Pay attention to when your baby is most active. You may notice your baby's sleep and wake cycles. You may also notice things that make your baby move more. You should do a fetal movement count:  When your baby is normally most active.  At the same time each day. A good time to count movements is while you are resting, after having something to eat and drink. How do I count fetal movements? 1. Find a quiet, comfortable area. Sit, or lie down on your side. 2. Write down the date, the start time and stop time, and the number of movements that you felt between those two times. Take this information with you to your health care visits. 3. Write down your start time when you feel the first movement. 4. Count kicks, flutters, swishes, rolls, and jabs. You should feel at least 10 movements. 5. You may stop counting after you have felt 10 movements, or if you have been counting for 2 hours. Write down the stop time. 6. If you do not feel 10 movements in 2 hours, contact your health care provider for further instructions. Your health care provider may want to do additional tests to assess your baby's well-being. Contact a health care provider if:  You feel fewer than 10 movements in 2 hours.  Your baby is not moving like he or she usually does. Date: ____________ Start time: ____________ Stop time: ____________ Movements: ____________ Date: ____________ Start time: ____________ Stop time: ____________ Movements: ____________ Date: ____________  Start time: ____________ Stop time: ____________ Movements: ____________ Date: ____________ Start time: ____________ Stop time: ____________ Movements: ____________ Date: ____________ Start time: ____________ Stop time: ____________ Movements: ____________ Date: ____________ Start time: ____________ Stop time: ____________ Movements: ____________ Date: ____________ Start time: ____________ Stop time: ____________ Movements: ____________ Date: ____________ Start time: ____________ Stop time: ____________ Movements: ____________ Date: ____________ Start time: ____________ Stop time: ____________ Movements: ____________ This information is not intended to replace advice given to you by your health care provider. Make sure you discuss any questions you have with your health care provider. Document Revised: 08/27/2018 Document Reviewed: 08/27/2018 Elsevier Patient Education  2020 Elsevier Inc. [employer] of the uterus can occur throughout pregnancy, but they are not always a sign that you are in labor. You may have practice contractions called Braxton Hicks contractions. These false labor contractions are sometimes confused with true labor. What are [place] contractions? Braxton Hicks contractions are tightening movements that occur in the muscles of the uterus before labor. Unlike true labor contractions, these contractions do not result in opening (dilation) and thinning of the cervix. Toward the end of pregnancy (32-34 weeks), Braxton Hicks contractions can happen more often and may become stronger. These contractions are sometimes difficult to tell apart from true labor because they can be very uncomfortable. You should not feel embarrassed if you go to the hospital with false labor. Sometimes, the only way to tell if you are in true labor is for your health care provider to look for changes in the cervix. The health care provider  will do a physical exam and may  monitor your contractions. If you are not in true labor, the exam should show that your cervix is not dilating and your water has not broken. °If there are no other health problems associated with your pregnancy, it is completely safe for you to be sent home with false labor. You may continue to have Braxton Hicks contractions until you go into true labor. °How to tell the difference between true labor and false labor °True labor °· Contractions last 30-70 seconds. °· Contractions become very regular. °· Discomfort is usually felt in the top of the uterus, and it spreads to the lower abdomen and low back. °· Contractions do not go away with walking. °· Contractions usually become more intense and increase in frequency. °· The cervix dilates and gets thinner. °False labor °· Contractions are usually shorter and not as strong as true labor contractions. °· Contractions are usually irregular. °· Contractions are often felt in the front of the lower abdomen and in the groin. °· Contractions may go away when you walk around or change positions while lying down. °· Contractions get weaker and are shorter-lasting as time goes on. °· The cervix usually does not dilate or become thin. °Follow these instructions at home: ° °· Take over-the-counter and prescription medicines only as told by your health care provider. °· Keep up with your usual exercises and follow other instructions from your health care provider. °· Eat and drink lightly if you think you are going into labor. °· If Braxton Hicks contractions are making you uncomfortable: °? Change your position from lying down or resting to walking, or change from walking to resting. °? Sit and rest in a tub of warm water. °? Drink enough fluid to keep your urine pale yellow. Dehydration may cause these contractions. °? Do slow and deep breathing several times an hour. °· Keep all follow-up prenatal visits as told by your health care provider. This is important. °Contact a  health care provider if: °· You have a fever. °· You have continuous pain in your abdomen. °Get help right away if: °· Your contractions become stronger, more regular, and closer together. °· You have fluid leaking or gushing from your vagina. °· You pass blood-tinged mucus (bloody show). °· You have bleeding from your vagina. °· You have low back pain that you never had before. °· You feel your baby’s head pushing down and causing pelvic pressure. °· Your baby is not moving inside you as much as it used to. °Summary °· Contractions that occur before labor are called Braxton Hicks contractions, false labor, or practice contractions. °· Braxton Hicks contractions are usually shorter, weaker, farther apart, and less regular than true labor contractions. True labor contractions usually become progressively stronger and regular, and they become more frequent. °· Manage discomfort from Braxton Hicks contractions by changing position, resting in a warm bath, drinking plenty of water, or practicing deep breathing. °This information is not intended to replace advice given to you by your health care provider. Make sure you discuss any questions you have with your health care provider. °Document Revised: 12/20/2016 Document Reviewed: 05/23/2016 °Elsevier Patient Education © 2020 Elsevier Inc. ° °ARE[facility]ATR[facility]/FAMILY PRACTICE PHYSICIANS ° °[place] ([address]) °• [facility] °o Chambliss, MD; Eniola, MD; Hale, MD; Hensel, MD; McDiarmid, MD; McIntyer, MD; Neal, MD; Walden, MD °o [address] [place], [place] [address] °o [phone] °o Mon-Fri 8:30-12:30, 1:30-5:00 °o Providers come to see babies   at [facility] °o Accepting Medicaid °• [facility] °o Limited providers who accept newborns: Koirala, MD; Morrow, MD; Wolters, MD °o [address], [place], [place] [address] °o [phone] °o Mon-Fri 8:00-5:30 °o Babies seen by providers at  [facility] °o Does NOT accept Medicaid °o Please call early in hospitalization for appointment (limited availability)  °• [facility] °o Mulberry, MD °o [address] [place], [place] [address] °o [phone] °o Mon, Tue, Thur, Fri 8:30-5:00, Wed 10:00-7:00 (closed 1-2pm) °o Babies seen by [facility] providers °o Accepting Medicaid °• [facility] °o Rubin, MD °o [address], [place], [place] [address] °o [phone] °o Mon-Fri 8:30-5:00, Sat 8:30-12:00 °o Provider comes to see babies at [facility] °o Accepting Medicaid °o Must have been referred from current patients or contacted office prior to delivery °• [facility] ([facility]) °o Brown, MD; Chandler, MD; Ettefagh, MD; Grant, MD; Lester, MD; McCormick, MD; McQueen, MD; Prose, MD; Simha, MD; Stanley, MD; Stryffeler, NP; Tebben, NP °o [address], [place], [place] [address] °o [phone] °o Mon, Tue, Thur, Fri 8:30-5:30, Wed 9:30-5:30, Sat 8:30-12:30 °o Babies seen by [facility] providers °o Accepting Medicaid °o Only accepting infants of first-time parents or siblings of current patients °o Hospital discharge coordinator will make follow-up appointment °• Jack Amos °o [address], [place], [place]  [address] °o [phone]   Fax - [phone] °• [facility] °o [address], [place], [place]  [address] °o Phone - [phone]   Fax - [phone] °• Shilpa Gosrani °o [address], [place], [place]  [address] °o [phone] ° °[place] ([address]) °• [facility] °o Bates, MD; [place], MD; Cooper, Cox, MD; MD; Davis, MD; Dovico, MD; Ettefaugh, MD; Little, MD; Lowe, MD; Keiffer, MD; Melvin, MD; Sumner, MD; Williams, MD °o [address], [place], [place] [address] °o [phone] °o Mon-Fri 8:30-5:00 (extended evenings Mon-Thur as needed), Sat-Sun  10:00-1:00 °o Providers come to see babies at [facility] °o Accepting Medicaid for families of first-time babies and families with all children in the household age 3 and under. Must register with office prior to making appointment (M-F only). °• [facility] °o Henson, NP; Knapp, MD; Lalonde, MD; Tysinger, PA °o [address] [place], [place] [address] °o [phone] °o Mon-Fri 8:00-5:00 °o Babies seen by providers at [facility] °o Does NOT accept Medicaid/Commercial Insurance Only °• [facility] ([facility]  °o Artis, MD; Barnes, MD; Bratton, MD; Coccaro, MD; Lockett Gardner, MD; Kramer, MD; Marshall, MD; Netherton, MD; Poleto, MD; Skinner, MD °o [address] [place], [place] [address] °o [phone] °o Mon-Fri 8:30-5:30, Sat (Oct.-Mar.) 9:00-1:00 °o Babies seen by providers at [facility] °o Accepting Medicaid ° °[place] ([address]) °• [facility] °o Reid, MD; Warner, MD °o [address], [place], [place] [address] °o [phone] °o Mon-Fri 8:30-5:00, Sat 8:30-12:00 °o Providers come to see babies at [facility] °o Does NOT accept Medicaid °• [facility] °o Becker, PA; Hagler, MD; Scifres, PA; Sun, MD; Swayne, MD °o [address], [place], [place] [address] °o [phone] °o Mon-Fri 8:00-5:00 °o Babies seen by providers at [facility] °o Does NOT accept Medicaid °o Only accepting babies of parents who are patients °o Please call   early in hospitalization for appointment (limited availability) °• [facility] °o Clark, MD; Frye, MD; Kelleher, MD; Mack, NP; Miller, MD; O'Keller, MD; Patterson, NP; Pudlo, MD; Puzio, MD; Thomas, MD; Tucker, MD; Twiselton, MD °o [address], [place], [place] [address] °o [phone] °o Mon-Fri 8:00-5:00, Sat 9:00-12:00 °o Providers come to see babies at [facility] °o Does NOT accept  Medicaid ° °[place] ([address]) °• [facility] °o Limited providers accepting new patients: Brake, NP; Wharton, PA °o [address], [place], [place] [address] °o [phone] °o Mon-Fri 8:00-5:00 °o Babies seen by providers at [facility] °o Does NOT accept Medicaid °o Only accepting babies of parents who are patients °o Please call early in hospitalization for appointment (limited availability) °• [facility] °o Gay, MD; Quinlan, MD °o [address] [place], [place] [address] °o [phone] (press 1 to schedule appointment) °o Mon-Fri 8:00-5:00 °o Providers come to see babies at [facility] °o Does NOT accept Medicaid °• [facility] °o Mazer, MD °o [address] [place], [place] [address] °o [phone] °o Mon-Fri 8:30-5:00 (lunch 12:30-1:00), extended hours by appointment only Wed 5:00-6:30 °o Babies seen by [facility] providers °o Accepting Medicaid °• [facility] [place] °o Banks, MD; Jordan, MD; Koberlein, MD °o [address], [place], [place] [address] °o [phone] °o Mon-Fri 8:00-5:00 °o Babies seen by [facility] providers °o Does NOT accept Medicaid °• [facility] °o Parker, MD; Hunter, MD; Wallace, DO °o [address] [place], [place] [address] °o [phone] °o Mon-Fri 8:00-5:00 °o Babies seen by [facility] providers °o Does NOT accept Medicaid °• [facility] °o Brandon, PA; Brecken, PA; Christy, NP; Dees, MD; DeClaire, MD; DeWeese, MD; Hansen, NP; Mills, NP; Parrish, NP; Smoot, NP; Summer, MD; Vapne, MD °o [address] [place], [place] [address] °o [phone] °o Mon-Fri 8:30-5:00, Sat 10:00-1:00 °o Providers come to see babies at [facility] °o Does NOT accept Medicaid °o Free prenatal information session Tuesdays at 4:45pm °• [facility] °o Bouska, MD; Gordon, PA; Jeffery, PA; Weber, PA °o [address] [place] [address] °o [phone] °o Mon-Fri 7:30-5:30 °o Babies seen by [facility] providers °• [facility] °o [address], [place], [place]  [address] °o [phone]   Fax - [phone] ° °[place] ([address]) °• [facility] °o Reese, MD °o [address] [place], [place] [address] °o [phone] °o Mon-Thur 8:00-6:00 °o Providers come to see babies at [facility] °o Accepting Medicaid °• [facility] °o Anderson, NP; Badger, MD; Beal, PA; Spencer, PA °o [address] [place], [place] [address] °o [phone] °o Mon-Thur 7:30-7:30, Fri 7:30-4:30 °o Babies seen by [facility] providers °o Accepting Medicaid °• [facility] °o Agbuya, MD; Klett, NP; Romgoolam, MD °o [address], [place],  [address] °o [phone] °o Mon-Fri 8:30-5:00, Sat 8:30-12:00 °o Providers come to see babies at [facility] °o Accepting Medicaid °o Must have “Meet & Greet” appointment at office prior to delivery °• [facility] ([facility] °o McCord, MD; Wallace, MD; Wood, MD °o [address], [place],  [address] °o [phone] °o Mon-Wed 8:00-6:00, Thur-Fri 8:00-5:00, Sat 9:00-12:00 °o Providers come to see babies at [facility] °o Does NOT accept Medicaid °o Only accepting siblings of current patients °• [facility]  °  o [address], [place], [place]  [address] °o [phone]   Fax - [phone] °• [facility] °o [address], [place], [place]  [address] °o [phone]   Fax - [phone] ° °[place] ([address]) °• [facility] °o Cirigliano, DO; Matthews, DO °o [address] [place], [place] [address] °o [phone] °o Mon-Fri 7:00-5:00 °o Babies seen by [facility] providers °o Does NOT  accept Medicaid °• [facility] °o Briscoe, MD; Howley, PA; Moreira, PA °o [address], [place], [place] [address] °o [phone] °o Mon-Fri 8:00-5:00 °o Babies seen by [facility] providers °o Accepting Medicaid °• [facility] °o Boyd, MD; Church, PA; Jones, NP; Osborn, PA °o [address], [place], [place] [address] °o [phone] °o Mon-Fri 8:00-5:00 °o Babies seen by providers at [facility] °o Accepting Medicaid ° °[place] ([address]) °• [facility] [place] °o Wendling, DO °o [address] [place], [place] [address] °o [phone] °o Mon-Fri 8:00-5:00 °o Babies seen by [facility] providers °o Does NOT accept Medicaid °o Limited availability, please call early in hospitalization to schedule follow-up °• [facility] °o Calderon, PA; Cummings, MD; Dillard, MD; Martin, PA; Olson, MD; VanDeven, PA °o [address], [place], [place] [address] °o [phone] °o Mon-Fri 8:30-5:00, Sat 9:00-12:00 °o Babies seen by providers at [facility] °o Accepting Medicaid °o Please register online then schedule online or call office °o [email or web address] °• [facility] ([facility] °o Hunter, NP; Kumar, MD; Martin Rogers, PA °o [address], [place], [place] [address] °o [phone] °o Mon-Fri 8:00-5:00 °o Babies seen by providers at [facility] °o Accepting Medicaid °• [facility] ([facility] °o Holiday [place], MD; Kristi Fleenor, NP; West, MD °o [address], [place], [place] [address] °o [phone] °o Mon-Fri 8:00-5:30, Sat&Sun by appointment (phones open at 8:30) °o Babies seen by [facility] providers °o Accepting Medicaid °o Must be a first-time baby or sibling of current patient °• [facility] [place]  °o [address], [place], [place]  [address] °o [phone]   Fax - [phone] ° °[place] ([address]) °• [facility] [place] [facility] °o Brown, PA; Cowen, PA; Rice, MD; Helton, PA; Spry, MD °o [address] [place], [place] [address] °o [phone] °o Mon-Thur 8:00-7:00, Fri 8:00-5:00, Sat 8:00-12:00, Sun 9:00-12:00 °o Babies seen by [facility] providers °o Accepting Medicaid °• [facility] [place] °o Coe-Goins, MD; Marshall, MD; Pierre-Louis, MD °o [address], [place], [place] [address] °o [phone] °o Mon-Thur 8:00-5:00 °o Babies seen by providers at [facility] °o Accepting Medicaid °• [facility] °o Bratton, MD; Coe-Goins, MD; Hayes, MD; Lewis, MD; List, MD; Lott, MD; Marshall, MD; Moran, MD; O'Neal, MD; Pierre-Louis, MD; Pitonzo, MD; Scholer, MD; Spangle, MD °o [address] [place], [place] [address] °o [phone] °o Mon-Fri 8:00-5:30, Sat (Oct.-Mar.) 9:00-1:00 °o Babies seen by providers at [facility] °o Accepting Medicaid °o Must fill out new patient packet, available online at [email or web address] °• [facility] ([facility]) °o Friddle, NP; Harris, NP; Kelly, NP; Logan,   MD; Melvin, PA; Poth, MD; Ramadoss, MD; Stanton, NP °o [address], [place], [place] [address] °o [phone] °o Mon-Thur 8:00-5:30, Fri 8:00-5:00 °o Babies seen by providers at [facility] °o Accepting Medicaid ° °[place] ([address]) °• [facility] °o Dixon, PA; Scott[place], MD; Pickard, MD; Tapia, PA °o [address], [place], [place] [address] °o [phone] °o Mon-Fri 8:00-5:00 °o Babies seen by providers at [facility] °o Accepting Medicaid  ° °[place] ([address]) °• [facility] [place] °o Masneri, DO; Meyers, MD; Nelson, PA °o [address], [place], [place]  [address] °o [phone] °o Mon-Fri 8:00-5:00 °o Babies seen by providers at [facility] °o Does NOT accept Medicaid °o Limited appointment availability, please call early in hospitalization ° °• [facility] [place] °o Kunedd, DO; McGowen, MD °o [address], [place], [place] [address] °o [phone] °o Mon-Fri 8:00-5:00 °o Babies seen by [facility] providers °o Does NOT accept Medicaid °• [facility] [place] °o Cameron, MD; MacDonald, MD; Michaels, PA; Nayak, MD °o [address] [place] [address], [place], [place] [address] °o [phone] °o Mon-Fri 8:00-5:00 °o After hours clinic ([address] [place], [place] [address]) [phone] Mon-Fri 5:00-8:00, Sat 12:00-6:00, Sun 10:00-4:00 °o Babies seen by [facility] providers °o Accepting Medicaid °• [facility] [place] °o [address], [place], [place]  [address] °o [phone]   Fax - [phone] ° °[place] ([address]) °• [facility] [place] °o Andy, MD °o [address], [place], [place] [address] °o [phone] °o Mon-Fri 8:00-5:00 °o Babies seen by [facility] providers °o Does NOT accept Medicaid °• [facility] [place] ([facility] [place]) °o Eksir, MD °o [address], [place], [place] [address] °o [phone] °o Mon-Thur 8:00-7:00, Fri 8:00-5:00, Sat 8:00-12:00 °o Babies seen by providers at [facility] °o Accepting Medicaid - but does not have vaccinations in office (must be received elsewhere) °o Limited availability, please call early in hospitalization ° °[place] ([address]) °• [facility]  °o Charlene Flemming, MD °o [address], [place] [address] °o [phone]  Fax [phone] ° ° °

## 2019-05-26 LAB — CBC
Hematocrit: 34.1 % (ref 34.0–46.6)
Hemoglobin: 11.6 g/dL (ref 11.1–15.9)
MCH: 32.7 pg (ref 26.6–33.0)
MCHC: 34 g/dL (ref 31.5–35.7)
MCV: 96 fL (ref 79–97)
Platelets: 182 10*3/uL (ref 150–450)
RBC: 3.55 x10E6/uL — ABNORMAL LOW (ref 3.77–5.28)
RDW: 12.9 % (ref 11.7–15.4)
WBC: 7.6 10*3/uL (ref 3.4–10.8)

## 2019-05-26 LAB — RPR: RPR Ser Ql: NONREACTIVE

## 2019-05-26 LAB — GLUCOSE TOLERANCE, 2 HOURS W/ 1HR
Glucose, 1 hour: 128 mg/dL (ref 65–179)
Glucose, 2 hour: 104 mg/dL (ref 65–152)
Glucose, Fasting: 67 mg/dL (ref 65–91)

## 2019-05-26 LAB — HIV ANTIBODY (ROUTINE TESTING W REFLEX): HIV Screen 4th Generation wRfx: NONREACTIVE

## 2019-05-31 ENCOUNTER — Other Ambulatory Visit: Payer: Self-pay | Admitting: *Deleted

## 2019-05-31 ENCOUNTER — Ambulatory Visit (HOSPITAL_COMMUNITY): Payer: PRIVATE HEALTH INSURANCE | Attending: Obstetrics and Gynecology

## 2019-05-31 ENCOUNTER — Ambulatory Visit: Payer: PRIVATE HEALTH INSURANCE | Admitting: *Deleted

## 2019-05-31 ENCOUNTER — Other Ambulatory Visit: Payer: Self-pay

## 2019-05-31 DIAGNOSIS — D259 Leiomyoma of uterus, unspecified: Secondary | ICD-10-CM

## 2019-05-31 DIAGNOSIS — O09513 Supervision of elderly primigravida, third trimester: Secondary | ICD-10-CM

## 2019-05-31 DIAGNOSIS — O3413 Maternal care for benign tumor of corpus uteri, third trimester: Secondary | ICD-10-CM | POA: Diagnosis not present

## 2019-05-31 DIAGNOSIS — Z362 Encounter for other antenatal screening follow-up: Secondary | ICD-10-CM

## 2019-05-31 DIAGNOSIS — Z3401 Encounter for supervision of normal first pregnancy, first trimester: Secondary | ICD-10-CM | POA: Insufficient documentation

## 2019-05-31 DIAGNOSIS — Z3A28 28 weeks gestation of pregnancy: Secondary | ICD-10-CM

## 2019-06-22 ENCOUNTER — Ambulatory Visit (INDEPENDENT_AMBULATORY_CARE_PROVIDER_SITE_OTHER): Payer: PRIVATE HEALTH INSURANCE | Admitting: Obstetrics and Gynecology

## 2019-06-22 ENCOUNTER — Encounter: Payer: Self-pay | Admitting: Medical

## 2019-06-22 DIAGNOSIS — Z3401 Encounter for supervision of normal first pregnancy, first trimester: Secondary | ICD-10-CM

## 2019-06-22 DIAGNOSIS — Z3A31 31 weeks gestation of pregnancy: Secondary | ICD-10-CM

## 2019-06-22 NOTE — Progress Notes (Signed)
   TELEHEALTH OBSTETRICS VISIT ENCOUNTER NOTE  I connected with Tracy Combs on 06/22/19 at  8:10 AM EDT by telephone at home and verified that I am speaking with the correct person using two identifiers.   I discussed the limitations, risks, security and privacy concerns of performing an evaluation and management service by telephone and the availability of in person appointments. I also discussed with the patient that there may be a patient responsible charge related to this service. The patient expressed understanding and agreed to proceed.  Subjective:  Tracy Combs is a 34 y.o. G1P0 at [redacted]w[redacted]d being followed for ongoing prenatal care.  She is currently monitored for the following issues for this low-risk pregnancy and has Encounter for supervision of normal first pregnancy in first trimester; AMA (advanced maternal age) primigravida 58+; Family history of ovarian cancer; Intervertebral disc disorders with radiculopathy, lumbar region; and Uterine fibroid in pregnancy on their problem list.  Patient reports no complaints. Reports fetal movement. Denies any contractions, bleeding or leaking of fluid.   The following portions of the patient's history were reviewed and updated as appropriate: allergies, current medications, past family history, past medical history, past social history, past surgical history and problem list.   Objective:   General:  Alert, oriented and cooperative.   Mental Status: Normal mood and affect perceived. Normal judgment and thought content.  Rest of physical exam deferred due to type of encounter  Assessment and Plan:  Pregnancy: G1P0 at [redacted]w[redacted]d  1. Encounter for supervision of normal first pregnancy in first trimester  Normal 2 hour GTT  F/u US scheduled at 36 weeks to evaluate uterine fibroid size and location.  Will check BP later today and enter into babyscripts Discussed Peds offices- and process for preconception visit. Will discuss at next visit.    There  are no diagnoses linked to this encounter. Preterm labor symptoms and general obstetric precautions including but not limited to vaginal bleeding, contractions, leaking of fluid and fetal movement were reviewed in detail with the patient.  I discussed the assessment and treatment plan with the patient. The patient was provided an opportunity to ask questions and all were answered. The patient agreed with the plan and demonstrated an understanding of the instructions. The patient was advised to call back or seek an in-person office evaluation/go to MAU at [facility] for any urgent or concerning symptoms. Please refer to After Visit Summary for other counseling recommendations.   I provided 11 minutes of non-face-to-face time during this encounter.  Return in about 2 weeks (around 07/06/2019) for Virtual visit is ok .  Future Appointments  Date Time Provider [place]  07/27/2019  8:00 AM [facility] NURSE [facility]  07/27/2019  8:00 AM [facility] US1 [facility]US [place], NP Center for [employer], [place]

## 2019-06-22 NOTE — Progress Notes (Signed)
Pt will take BP later today when she gets home from work and send it to [place]

## 2019-06-22 NOTE — Patient Instructions (Signed)
This is our list of surround [place] [phone]) . [facility] Davy Pique, MD; Gwendlyn Deutscher, MD; Walker Kehr, MD; Andria Frames, MD; McDiarmid, MD; Dutch Quint, MD; Nori Riis, MD; Mingo Amber, [address] [place], [place] [address] o [phone] o Mon-Fri 8:30-12:30, 1:30-5:00 o Providers come to see babies at [facility] o Accepting Medicaid . [place] at [place] providers who accept newborns: Dorthy Cooler, MD; Orland Mustard, MD; Stephanie Acre, MD o [place], [place], [place] [address] o [phone] o Mon-Fri 8:00-5:30 o Babies seen by providers at [facility] o Does NOT accept Medicaid o Please call early in hospitalization for appointment (limited availability)  . Mustard Arc[place], MD o [address], [place], [place] [address] o [phone] o Mon, Tue, Thur, Fri 8:30-5:00, Wed 10:00-7:00 (closed 1-2pm) o Babies seen by [facility] providers o Accepting Medicaid . [place], MD o [place], [place], [place] [address] o [phone] o Mon-Fri 8:30-5:00, Sat 8:30-12:00 o Provider comes to see babies at [place] Medicaid o Must have been referred from current patients or contacted office prior to delivery . [facility] ([place] for [place]) Franne Forts, MD; Tamera Punt, MD; Doneen Poisson, MD; Fatima Sanger, MD; Wynetta Emery, MD; Jess Barters, MD; Tami Ribas, MD; Herbert Moors, MD; Derrell Lolling, MD; Dorothyann Peng, MD; Lucious Groves, NP; Baldo Ash, NP o [address], [place], [place] [address] o [phone] o Mon, Tue, Thur, Fri 8:30-5:30, Wed 9:30-5:30, Sat 8:30-12:30 o Babies seen by [facility] providers o Accepting Medicaid o Only accepting infants of first-time parents or siblings of current patients [facility] discharge coordinator will make follow-up  appointment . Baltazar Najjar o [place] [address] [place], [place]  [address] o [phone]   Fax - [phone] . [facility] o [id number], [place], [place]  [address] o Phone - [phone]   Fax [phone] . [place], [place], [place], [place]  [address] o [phone]  [place] [phone]) . [facility] Reginal Lutes, MD; Jacklynn Ganong, MD; Torrie Mayers, MD; MD; Rosana Hoes, MD; Servando Salina, MD; Rose Fillers, MD; Rex Kras, MD; Corinna Capra, MD; Volney American, MD; Trilby Drummer, MD; Janann Colonel, MD; Jimmye Norman, [address], [place], [place] [address] o [phone] o Mon-Fri 8:30-5:00 (extended evenings Mon-Thur as needed), Sat-Sun 10:00-1:00 o Providers come to see babies at [place] Medicaid for families of first-time babies and families with all children in the household age 60 and under. Must register with office prior to making appointment (M-F only). . [place], NP; Tomi Bamberger, MD; Redmond School, MD; [place], [address] [place], [place] [address] o [phone] o Mon-Fri 8:00-5:00 o Babies seen by providers at [facility] o Does NOT accept Medicaid/Commercial Insurance Only . [facility] [place] ([facility])  Marnee Guarneri, MD; Drema Dallas, MD; Montine Circle, MD; Vilma Prader, MD; Vanita Panda, MD; Alfonso Ramus, MD; Ruthann Cancer, MD; Roxanne Mins, MD; Rosalva Ferron, MD; Polly Cobia, MD o [place]., [place], [place] [address] o [phone] o Mon-Fri 8:30-5:30, Sat (Oct.-Mar.) 9:00-1:00 o Babies seen by providers at [place] [phone]) . [facility] Peers, MD; Suzan Slick, MD o [address], [place], [place] [address] o [phone] o Mon-Fri 8:30-5:00, Sat 8:30-12:00 o Providers come to see babies at  [facility] o Does NOT accept Medicaid . [place] at [place], [place]; [place], MD; [place], [place]; Nancy Fetter, MD;  Moreen Fowler, [place], [place], [place] [address] o [phone] o Mon-Fri 8:00-5:00 o Babies seen by providers at [facility] o Does NOT accept Medicaid o Only accepting babies of parents who are patients o Please call early in hospitalization for appointment (limited availability) . [facility] Blanca Friend, MD; Sharlene Motts, MD; Rod Can, MD; Warner Mccreedy, NP; Sabra Heck, MD; Ermalinda Memos, MD; Sharlett Iles, NP; Aurther Loft, MD; Jerrye Beavers, MD; Marcello Moores, MD; Berline Lopes, MD; Charolette Forward, MD o [place]. [place], [place], [place] [address] o [phone] o Mon-Fri 8:00-5:00, Sat 9:00-12:00 o Providers come to see babies at [facility] o Does NOT accept [facility] [phone]) . [place] at [place] providers accepting new patients: Dayna Ramus, NP; [place], [place], [place], [place] [address] o [phone] o Mon-Fri 8:00-5:00 o Babies seen by providers at [facility] o Does NOT accept Medicaid o Only accepting babies of parents who are patients o Please call early in hospitalization for appointment (limited availability) . [facility] Oswaldo Conroy, MD; Sheran Lawless, MD o [place]., [place], [place] [address] o [phone] (press 1 to schedule appointment) o Mon-Fri 8:00-5:00 o Providers come to see babies at [facility] o Does NOT accept Medicaid . [facility] Jodi Mourning, MD o [address] [place], [place] [address] o [phone] o Mon-Fri 8:30-5:00 (lunch 12:30-1:00), extended hours by appointment only Wed 5:00-6:30 o Babies seen by [facility] providers o Accepting Medicaid . [place] at Evalyn Casco, MD; [place], MD; Ethlyn Gallery, MD o [place], [place], [place] [address] o [phone] o Mon-Fri 8:00-5:00 o Babies seen by [facility] providers o Does NOT accept Medicaid . Therapist, music at [place], MD; Yong Channel, MD; [place], [address] [place], [place] [address] o [phone] o Mon-Fri 8:00-5:00 o Babies seen by [facility] providers o Does NOT accept Medicaid . [place], [place]; [place], [place]; [place], NP; Albertina Parr, MD; Frederic Jericho, MD; Ronney Lion, MD; Carlos Levering, NP; Jerelene Redden, NP; Tomasita Crumble, NP; Ronelle Nigh, NP; Corinna Lines, MD; [place], MD o [address] [place], [place] [address] o [phone] o Mon-Fri 8:30-5:00, Sat 10:00-1:00 o Providers come to see babies at [facility] o Does NOT accept Medicaid o Free prenatal information session Tuesdays at 4:45pm . [facility] Porfirio Oar, MD; [place], [place]; [place], [place]; [place], [place]., [place] [address] o [phone] o Mon-Fri 7:30-5:30 o Babies seen by [facility] providers . [facility] o [address], [place], [place],   [address] o [phone]   Fax - [phone]  [place] [phone] & [phone]) . [place] o [address] [place],  [address] o [phone] o Mon-Thur 8:00-6:00 o Providers come to see babies at [place] Medicaid . [place], NP; Melford Aase, MD; [place], [place]; [place], [place]., [place],  [address] o [phone] o Mon-Thur 7:30-7:30, Fri 7:30-4:30 o Babies seen by [facility] providers o Accepting Medicaid . [facility] Nyra Jabs, MD; Cristino Martes, NP; Gertie Baron, MD o [place] [address], [place],  [address] o [phone] o Mon-Fri 8:30-5:00, Sat 8:30-12:00 o Providers come to see babies at [place] Medicaid o Must have "Meet & Greet" appointment at office prior to delivery .  [place] ([place]) Jodene Nam, MD; Juleen China, MD; Clydene Laming, [place] [address], [place], [place] [address] o [phone] o Mon-Wed 8:00-6:00, Thur-Fri 8:00-5:00, Sat  9:00-12:00 o Providers come to see babies at [facility] o Does NOT accept Medicaid o Only accepting siblings of current patients . [facility] [place], [place], [place], [place]  [address] o [phone]   Fax [phone] . [place] at [place] [address] [place], [place]  [address] o [phone]   Fax - [place] [phone] & [phone]) . Therapist, music at [place], DO; [place], [address] [place], [place] [address] o [phone] o Mon-Fri 7:00-5:00 o Babies seen by [facility] providers o Does NOT accept Medicaid . [place], MD; [place], [place]; [place], [place], [place], [place] [address] o [phone] o Mon-Fri 8:00-5:00 o Babies seen by [facility] providers o Accepting Medicaid . [place], MD; [place], [place]; [place], NP; [place], [place], [place], [place] [address] o [phone] o Mon-Fri 8:00-5:00 o Babies seen by providers at [place] [phone]) . [facility] [place], [place] o [address] [place], [place] [address] o [phone] o Mon-Fri 8:00-5:00 o Babies seen by [facility] providers o Does NOT accept Medicaid o Limited availability, please call early in hospitalization to schedule follow-up . [facility] Leilani Merl, PA; Maisie Fus, MD; [place], MD; [place], [place]; Jeannine Kitten, MD; [place], [place] [facility] [address], [place], [place] [address] o [phone] o Mon-Fri 8:30-5:00, Sat 9:00-12:00 o Babies seen by providers at [facility] o Accepting Medicaid o Please register online then schedule online or call office o [email or web address] . [place]  ([place] at [place]) Kristian Covey, NP; Dwyane Dee, MD; Leonidas Romberg, PA o [address] [place], [place], [place] [address] o [phone] o Mon-Fri 8:00-5:00 o Babies seen by providers at [facility] o Accepting Medicaid . [place] ([facility] [employer]) Dairl Ponder, MD; Rayvon Char, NP; Melina Modena, MD o [address] [place], [place], [place] [address] o [phone] o Mon-Fri 8:00-5:30, Sat&Sun by appointment (phones open at 8:30) o Babies seen by [facility] providers o Accepting Medicaid o Must be a first-time baby or sibling of current patient . [place], [id number], [place], [place]  [address] o [phone]   Fax - [phone]  [place] [phone] & [phone]) . [place], [place]; [place], [place]; Benjamine Mola, MD; [place], [place]; Harrell Lark, MD o [address], [place], [place] [address] o [phone] o Mon-Thur 8:00-7:00, Fri 8:00-5:00, Sat 8:00-12:00, Sun 9:00-12:00 o Babies seen by [facility] providers o Accepting Medicaid . [facility] at [facility], MD; Ruthann Cancer, MD; [facility], MD o [address], [place], [place] [address] o [phone] o Mon-Thur 8:00-5:00 o Babies seen by providers at [facility] o Accepting Medicaid . [facility] at [place], MD; Coe[place], MD; Amedeo Plenty, MD; Bobby Rumpf, MD; List, MD; Lavonia Drafts, MD; Ruthann Cancer, MD; Selinda Eon, MD; Audie Box, MD; Jim Like, MD; Christie Nottingham, MD; [place],  MD; Modena Nunnery, MD o [address] [place], [place] [address] o [phone] o Mon-Fri 8:00-5:30, Sat (Oct.-Mar.) 9:00-1:00 o Babies seen by providers at [facility] o Accepting Medicaid o Must fill out new patient packet, available online at [email or web address] . [place] ([facility]) Barnabas Lister, NP;  Kenton Kingfisher, NP; Claiborne Billings, NP; Rolla Plate, MD; [place], [place]; Carola Rhine, MD; [place], MD; Delia Chimes, NP o [address], [place], [place] [address] o [phone] o Mon-Thur 8:00-5:30, Fri 8:00-5:00 o Babies seen by providers at [place] [phone]) . [place], [place]; [place], MD; Dennard Schaumann, MD; [place], [place] o [address] [place], [place] [address] o [phone] o Mon-Fri 8:00-5:00 o Babies seen by providers at [place] [phone]) . [place] at [place], [facility]; Olen Pel, MD; Jan [place], [place], [place], [place] [address] o [phone] o Mon-Fri 8:00-5:00 o Babies seen by providers at [facility] o Does NOT accept Medicaid o Limited appointment availability, please call early in hospitalization  . Therapist, music at [place], [place]; [place], [address] [place], [place] [address] o [phone] o Mon-Fri 8:00-5:00 o Babies seen by [facility] providers o Does NOT accept Medicaid . [facility] Su [place], MD; Guy Sandifer, MD; [place], [place]; [place], [place] [address], [place], [place] [address] o [phone] o Mon-Fri 8:00-5:00 o After hours clinic [facility] [place], [place] [address]) [phone] Mon-Fri 5:00-8:00, Sat 12:00-6:00, Sun 10:00-4:00 o Babies seen by [facility] providers o Accepting Medicaid . [place] at [facility] o [address] [place], [place]  [address] o [phone]   Fax - [phone]  [place] [phone]) . Therapist, music at [facility], MD o [address] [place], [place], [place] [address] o [phone] o Mon-Fri 8:00-5:00 o Babies seen by [facility] providers o Does NOT accept Medicaid . [place] ([place] at [place]) Bing Neighbors, MD o [address]  [place], [place], [place] [address] o [phone] o Mon-Thur 8:00-7:00, Fri 8:00-5:00, Sat 8:00-12:00 o Babies seen by providers at [facility] [place] o Accepting Medicaid - but does not have vaccinations in office (must be received elsewhere) o Limited availability, please call early in hospitalization  [place] ([phone]) . [place], [place] [address] [place] [address] o [phone]  Fax [phone]

## 2019-06-28 ENCOUNTER — Inpatient Hospital Stay (HOSPITAL_COMMUNITY)
Admission: AD | Admit: 2019-06-28 | Discharge: 2019-06-28 | Disposition: A | Discharge status: Home / Self Care | Payer: PRIVATE HEALTH INSURANCE | Attending: Obstetrics and Gynecology | Admitting: Obstetrics and Gynecology

## 2019-06-28 ENCOUNTER — Other Ambulatory Visit: Payer: Self-pay

## 2019-06-28 ENCOUNTER — Encounter (HOSPITAL_COMMUNITY): Payer: Self-pay | Admitting: Obstetrics and Gynecology

## 2019-06-28 DIAGNOSIS — O26893 Other specified pregnancy related conditions, third trimester: Secondary | ICD-10-CM | POA: Diagnosis not present

## 2019-06-28 DIAGNOSIS — R102 Pelvic and perineal pain: Secondary | ICD-10-CM | POA: Diagnosis not present

## 2019-06-28 DIAGNOSIS — O26899 Other specified pregnancy related conditions, unspecified trimester: Secondary | ICD-10-CM

## 2019-06-28 DIAGNOSIS — O3413 Maternal care for benign tumor of corpus uteri, third trimester: Secondary | ICD-10-CM | POA: Insufficient documentation

## 2019-06-28 DIAGNOSIS — R109 Unspecified abdominal pain: Secondary | ICD-10-CM | POA: Diagnosis not present

## 2019-06-28 DIAGNOSIS — Z79899 Other long term (current) drug therapy: Secondary | ICD-10-CM | POA: Insufficient documentation

## 2019-06-28 DIAGNOSIS — Z3A32 32 weeks gestation of pregnancy: Secondary | ICD-10-CM | POA: Insufficient documentation

## 2019-06-28 DIAGNOSIS — D259 Leiomyoma of uterus, unspecified: Secondary | ICD-10-CM | POA: Diagnosis not present

## 2019-06-28 LAB — URINALYSIS, ROUTINE W REFLEX MICROSCOPIC
Bilirubin Urine: NEGATIVE
Glucose, UA: NEGATIVE mg/dL
Hgb urine dipstick: NEGATIVE
Ketones, ur: NEGATIVE mg/dL
Leukocytes,Ua: NEGATIVE
Nitrite: NEGATIVE
Protein, ur: NEGATIVE mg/dL
Specific Gravity, Urine: 1.003 — ABNORMAL LOW (ref 1.005–1.030)
pH: 8 (ref 5.0–8.0)

## 2019-06-28 MED ORDER — CYCLOBENZAPRINE HCL 10 MG PO TABS: 10.0000 mg | ORAL_TABLET | Freq: Three times a day (TID) | ORAL | 2 refills | Status: DC | PRN

## 2019-06-28 NOTE — MAU Note (Signed)
Pt stated she has had allergy symptoms since Saturday. Has had runny nose that has resolved but now has a cough that had caused sharp  pain and pressure in her vaginal area. Thinks it might be round ligament pain like she had earlier in the pregnancy. Denies any vag bleeding or discharge. Good fetal movement reported.

## 2019-06-28 NOTE — MAU Provider Note (Signed)
History     CSN: [id number]  Arrival date and time: 06/28/19 1553   None     Chief Complaint  Patient presents with  . Pelvic Pain   HPI  [name] is a 34 y.o. female G1P0 @ [redacted]w[redacted]d here with new onset of pelvic pain that started last night while she was sleeping.  She attempted to get out of bed and felt shooting pain in her pelvis and her vagina.  The pain is sharp and comes and goes.  She denies vaginal discharge or vaginal bleeding.  She does wear a pregnancy support belt most days however she was very active this weekend and did not have the belt on.  She feels the belt helps some. She also feels pain in her pelvis that may have worsened after coughing this past weekend. She took flexeril which helped her sleep. She has asked for a refill. + fetal movement.   OB History    Gravida  1   Para      Term      Preterm      AB      Living        SAB      TAB      Ectopic      Multiple      Live Births              Past Medical History:  Diagnosis Date  . Back pain     Past Surgical History:  Procedure Laterality Date  . NO PAST SURGERIES      Family History  Problem Relation Age of Onset  . Diabetes Mother   . Cancer Sister 8       half sister- ovarian  . Stroke Neg Hx   . Hypertension Neg Hx     Social History   Tobacco Use  . Smoking status: Never Smoker  . Smokeless tobacco: Never Used  Substance Use Topics  . Alcohol use: Not Currently  . Drug use: Never    Allergies:  Allergies  Allergen Reactions  . Amoxicillin-Pot Clavulanate Rash    Medications Prior to Admission  Medication Sig Dispense Refill Last Dose  . cetirizine (ZYRTEC) 10 MG tablet Take 10 mg by mouth daily.   06/28/2019 at Unknown time  . diphenhydrAMINE HCl (BENADRYL PO) Take by mouth.   06/28/2019 at Unknown time  . famotidine (PEPCID) 20 MG tablet Take 20 mg by mouth 2 (two) times daily.   06/28/2019 at Unknown time  . Prenatal Vit-Fe Fumarate-FA  (MULTIVITAMIN-PRENATAL) 27-0.8 MG TABS tablet Take 1 tablet by mouth daily at 12 noon.   06/28/2019 at Unknown time  . acetaminophen (TYLENOL) 500 MG tablet Take 500 mg by mouth every 6 (six) hours as needed.     . cyclobenzaprine (FLEXERIL) 10 MG tablet Take 1 tablet (10 mg total) by mouth every 8 (eight) hours as needed for muscle spasms. (Patient not taking: Reported on 06/22/2019) 30 tablet 2   . Elastic Bandages & Supports (COMFORT FIT MATERNITY SUPP LG) MISC 1 Units by Does not apply route daily. (Patient not taking: Reported on 06/22/2019) 1 each 0    Results for orders placed or performed during the hospital encounter of 06/28/19 (from the past 48 hour(s))  Urinalysis, Routine w reflex microscopic     Status: Abnormal   Collection Time: 06/28/19  4:30 PM  Result Value Ref Range   Color, Urine STRAW (A) YELLOW   APPearance CLEAR CLEAR  Specific Gravity, Urine 1.003 (L) 1.005 - 1.030   pH 8.0 5.0 - 8.0   Glucose, UA NEGATIVE NEGATIVE mg/dL   Hgb urine dipstick NEGATIVE NEGATIVE   Bilirubin Urine NEGATIVE NEGATIVE   Ketones, ur NEGATIVE NEGATIVE mg/dL   Protein, ur NEGATIVE NEGATIVE mg/dL   Nitrite NEGATIVE NEGATIVE   Leukocytes,Ua NEGATIVE NEGATIVE    Comment: Performed at [facility] [address] [place], [place] [address]   Review of Systems  Constitutional: Negative for fever.  Gastrointestinal: Positive for abdominal pain.  Genitourinary: Positive for pelvic pain and vaginal pain. Negative for dysuria, vaginal bleeding and vaginal discharge.   Physical Exam   Blood pressure 113/60, pulse 85, temperature 98.9 F (37.2 C), resp. rate 18, height 5\' 1"  (1.549 m), weight 81.2 kg, last menstrual period 11/12/2018.  Physical Exam  Constitutional: She appears well-developed and well-nourished. No distress.  GI: Soft. She exhibits no distension and no mass. There is no abdominal tenderness. There is no rebound and no guarding.  Genitourinary:    Genitourinary Comments:  Cervix is closed, thick, posterior.    Skin: Skin is warm. She is not diaphoretic.  Psychiatric: Her behavior is normal.   Fetal Tracing: Baseline: 130 bpm Variability: Moderate  Accelerations: 15x15 Decelerations: None Toco: UI   MAU Course  Procedures  None   MDM  UA WNL Symptoms consistent with Round ligament pain.   Assessment and Plan   A:  1. Pain of round ligament affecting pregnancy, antepartum   2. Uterine fibroid in pregnancy     P:  Discharge home in stable condition Return to MAU if symptoms worsen Encouraged pelvic support belt regularly Rx: Flexeril- refill Keep OB appt She is scheduled on 7/6 for an [place] to evaluate size, location of fibroid.   Lezlie Lye, NP 06/28/2019 7:19 PM

## 2019-06-28 NOTE — Discharge Instructions (Signed)

## 2019-07-06 ENCOUNTER — Telehealth (INDEPENDENT_AMBULATORY_CARE_PROVIDER_SITE_OTHER): Payer: PRIVATE HEALTH INSURANCE | Admitting: Advanced Practice Midwife

## 2019-07-06 ENCOUNTER — Other Ambulatory Visit: Payer: Self-pay

## 2019-07-06 ENCOUNTER — Encounter: Payer: Self-pay | Admitting: Advanced Practice Midwife

## 2019-07-06 VITALS — BP 126/86 | Wt 178.0 lb

## 2019-07-06 DIAGNOSIS — Z3401 Encounter for supervision of normal first pregnancy, first trimester: Secondary | ICD-10-CM

## 2019-07-06 DIAGNOSIS — O09513 Supervision of elderly primigravida, third trimester: Secondary | ICD-10-CM

## 2019-07-06 DIAGNOSIS — O26893 Other specified pregnancy related conditions, third trimester: Secondary | ICD-10-CM

## 2019-07-06 DIAGNOSIS — Z3A33 33 weeks gestation of pregnancy: Secondary | ICD-10-CM

## 2019-07-06 DIAGNOSIS — Z3482 Encounter for supervision of other normal pregnancy, second trimester: Secondary | ICD-10-CM

## 2019-07-06 DIAGNOSIS — R102 Pelvic and perineal pain: Secondary | ICD-10-CM

## 2019-07-06 DIAGNOSIS — O26899 Other specified pregnancy related conditions, unspecified trimester: Secondary | ICD-10-CM

## 2019-07-06 NOTE — Patient Instructions (Signed)

## 2019-07-06 NOTE — Progress Notes (Signed)
   OBSTETRICS PRENATAL VIRTUAL VISIT ENCOUNTER NOTE  Provider location: [facility] [place] [facility]   I connected with Tracy Combs on 07/06/19 at  9:45 AM EDT by MyChart Video Encounter at home and verified that I am speaking with the correct person using two identifiers.   I discussed the limitations, risks, security and privacy concerns of performing an evaluation and management service virtually and the availability of in person appointments. I also discussed with the patient that there may be a patient responsible charge related to this service. The patient expressed understanding and agreed to proceed. Subjective:  Tracy Combs is a 34 y.o. G1P0 at [redacted]w[redacted]d being seen today for ongoing prenatal care.  She is currently monitored for the following issues for this low-risk pregnancy and has Encounter for supervision of normal first pregnancy in first trimester; AMA (advanced maternal age) primigravida 71+; Family history of ovarian cancer; Intervertebral disc disorders with radiculopathy, lumbar region; and Uterine fibroid in pregnancy on their problem list.  Patient reports occasional sharp pains in pelvis.  Contractions: Not present. Vag. Bleeding: None.  Movement: Present. Denies any leaking of fluid.   The following portions of the patient's history were reviewed and updated as appropriate: allergies, current medications, past family history, past medical history, past social history, past surgical history and problem list.   Objective:   Vitals:   07/06/19 0953  BP: 126/86  Weight: 178 lb (80.7 kg)    Fetal Status:     Movement: Present     General:  Alert, oriented and cooperative. Patient is in no acute distress.  Respiratory: Normal respiratory effort, no problems with respiration noted  Mental Status: Normal mood and affect. Normal behavior. Normal judgment and thought content.  Rest of physical exam deferred due to type of encounter  Imaging: No  results found.  Assessment and Plan:  Pregnancy: G1P0 at [redacted]w[redacted]d 1. Pain of round ligament affecting pregnancy, antepartum     Improved     Was evaluated in MAU and no PTL found  2. Encounter for supervision of normal first pregnancy in first trimester     Has [place] scheduled for eval of fibroid.  They stated depending on size and location, may need elective C/S. Delivery.  Discussed this would be done around 39 wks.  3. Encounter for supervision of other normal pregnancy in second trimester   4. Primigravida of advanced maternal age in third trimester   Preterm labor symptoms and general obstetric precautions including but not limited to vaginal bleeding, contractions, leaking of fluid and fetal movement were reviewed in detail with the patient. I discussed the assessment and treatment plan with the patient. The patient was provided an opportunity to ask questions and all were answered. The patient agreed with the plan and demonstrated an understanding of the instructions. The patient was advised to call back or seek an in-person office evaluation/go to MAU at [facility] for any urgent or concerning symptoms. Please refer to After Visit Summary for other counseling recommendations.   I provided 10 minutes of face-to-face time during this encounter.  Return in about 2 weeks (around 07/20/2019) for [facility].  Future Appointments  Date Time Provider [place]  07/19/2019  9:15 AM Lavonia Drafts, MD [facility] None  07/27/2019  8:00 AM [facility] NURSE [facility]  07/27/2019  8:00 AM [facility] US1 [facility] [place], [place] for [employer], [place]

## 2019-07-15 ENCOUNTER — Encounter: Payer: PRIVATE HEALTH INSURANCE | Admitting: Obstetrics & Gynecology

## 2019-07-19 ENCOUNTER — Encounter: Payer: Self-pay | Admitting: Obstetrics & Gynecology

## 2019-07-19 ENCOUNTER — Other Ambulatory Visit: Payer: Self-pay

## 2019-07-19 ENCOUNTER — Ambulatory Visit (INDEPENDENT_AMBULATORY_CARE_PROVIDER_SITE_OTHER): Payer: PRIVATE HEALTH INSURANCE | Admitting: Obstetrics & Gynecology

## 2019-07-19 ENCOUNTER — Other Ambulatory Visit (HOSPITAL_COMMUNITY)
Admission: RE | Admit: 2019-07-19 | Discharge: 2019-07-19 | Disposition: A | Discharge status: Home / Self Care | Payer: PRIVATE HEALTH INSURANCE | Source: Ambulatory Visit | Attending: Obstetrics & Gynecology | Admitting: Obstetrics & Gynecology

## 2019-07-19 VITALS — BP 119/82 | HR 95 | Wt 183.0 lb

## 2019-07-19 DIAGNOSIS — D259 Leiomyoma of uterus, unspecified: Secondary | ICD-10-CM

## 2019-07-19 DIAGNOSIS — O3410 Maternal care for benign tumor of corpus uteri, unspecified trimester: Secondary | ICD-10-CM

## 2019-07-19 DIAGNOSIS — Z3A35 35 weeks gestation of pregnancy: Secondary | ICD-10-CM

## 2019-07-19 DIAGNOSIS — O09519 Supervision of elderly primigravida, unspecified trimester: Secondary | ICD-10-CM

## 2019-07-19 DIAGNOSIS — Z3401 Encounter for supervision of normal first pregnancy, first trimester: Secondary | ICD-10-CM | POA: Insufficient documentation

## 2019-07-19 NOTE — Progress Notes (Signed)
° °  PRENATAL VISIT NOTE  Subjective:  Tracy Combs is a 34 y.o. G1P0 at [redacted]w[redacted]d being seen today for ongoing prenatal care.  She is currently monitored for the following issues for this low-risk pregnancy and has Encounter for supervision of normal first pregnancy in first trimester; AMA (advanced maternal age) primigravida 40+; Family history of ovarian cancer; Intervertebral disc disorders with radiculopathy, lumbar region; and Uterine fibroid in pregnancy on their problem list.  Patient reports no complaints.  Contractions: Not present. Vag. Bleeding: None.  Movement: Present. Denies leaking of fluid.   The following portions of the patient's history were reviewed and updated as appropriate: allergies, current medications, past family history, past medical history, past social history, past surgical history and problem list.   Objective:   Vitals:   07/19/19 0903  BP: 119/82  Pulse: 95  Weight: 183 lb (83 kg)    Fetal Status: Fetal Heart Rate (bpm): 142   Movement: Present     General:  Alert, oriented and cooperative. Patient is in no acute distress.  Skin: Skin is warm and dry. No rash noted.   Cardiovascular: Normal heart rate noted  Respiratory: Normal respiratory effort, no problems with respiration noted  Abdomen: Soft, gravid, appropriate for gestational age.  Pain/Pressure: Absent     Pelvic: Cervical exam performed in the presence of a chaperone        Extremities: Normal range of motion.  Edema: Trace  Mental Status: Normal mood and affect. Normal behavior. Normal judgment and thought content.   Assessment and Plan:  Pregnancy: G1P0 at [redacted]w[redacted]d 1. Encounter for supervision of normal first pregnancy in first trimester GBS and cx done today  2. Uterine fibroid in pregnancy The head is well applied and does not appear to be obstructed by the fibroid. Will allow pt to labor spontaneously.    3. AMA   Preterm labor symptoms and general obstetric precautions including but not  limited to vaginal bleeding, contractions, leaking of fluid and fetal movement were reviewed in detail with the patient. Please refer to After Visit Summary for other counseling recommendations.   Return in about 2 weeks (around 08/02/2019).  Future Appointments  Date Time Provider [place]  07/27/2019  8:00 AM [facility] NURSE [facility]  07/27/2019  8:00 AM [facility] US1 [facility]  07/29/2019 11:15 AM Truett Mainland, DO [facility] None  08/05/2019 11:00 AM Truett Mainland, DO [facility] None  08/12/2019  2:00 PM Lavonia Drafts, MD [facility] None    Lavonia Drafts, MD

## 2019-07-19 NOTE — Patient Instructions (Signed)
Third Trimester of Pregnancy The third trimester is from week 28 through week 40 (months 7 through 9). The third trimester is a time when the unborn baby (fetus) is growing rapidly. At the end of the ninth month, the fetus is about 20 inches in length and weighs 6-10 pounds. Body changes during your third trimester Your body will continue to go through many changes during pregnancy. The changes vary from woman to woman. During the third trimester:  Your weight will continue to increase. You can expect to gain 25-35 pounds (11-16 kg) by the end of the pregnancy.  You may begin to get stretch marks on your hips, abdomen, and breasts.  You may urinate more often because the fetus is moving lower into your pelvis and pressing on your bladder.  You may develop or continue to have heartburn. This is caused by increased hormones that slow down muscles in the digestive tract.  You may develop or continue to have constipation because increased hormones slow digestion and cause the muscles that push waste through your intestines to relax.  You may develop hemorrhoids. These are swollen veins (varicose veins) in the rectum that can itch or be painful.  You may develop swollen, bulging veins (varicose veins) in your legs.  You may have increased body aches in the pelvis, back, or thighs. This is due to weight gain and increased hormones that are relaxing your joints.  You may have changes in your hair. These can include thickening of your hair, rapid growth, and changes in texture. Some women also have hair loss during or after pregnancy, or hair that feels dry or thin. Your hair will most likely return to normal after your baby is born.  Your breasts will continue to grow and they will continue to become tender. A yellow fluid (colostrum) may leak from your breasts. This is the first milk you are producing for your baby.  Your belly button may stick out.  You may notice more swelling in your hands,  face, or ankles.  You may have increased tingling or numbness in your hands, arms, and legs. The skin on your belly may also feel numb.  You may feel short of breath because of your expanding uterus.  You may have more problems sleeping. This can be caused by the size of your belly, increased need to urinate, and an increase in your body's metabolism.  You may notice the fetus "dropping," or moving lower in your abdomen (lightening).  You may have increased vaginal discharge.  You may notice your joints feel loose and you may have pain around your pelvic bone. What to expect at prenatal visits You will have prenatal exams every 2 weeks until week 36. Then you will have weekly prenatal exams. During a routine prenatal visit:  You will be weighed to make sure you and the baby are growing normally.  Your blood pressure will be taken.  Your abdomen will be measured to track your baby's growth.  The fetal heartbeat will be listened to.  Any test results from the previous visit will be discussed.  You may have a cervical check near your due date to see if your cervix has softened or thinned (effaced).  You will be tested for Group B streptococcus. This happens between 35 and 37 weeks. Your health care provider may ask you:  What your birth plan is.  How you are feeling.  If you are feeling the baby move.  If you have had any abnormal   symptoms, such as leaking fluid, bleeding, severe headaches, or abdominal cramping.  If you are using any tobacco products, including cigarettes, chewing tobacco, and electronic cigarettes.  If you have any questions. Other tests or screenings that may be performed during your third trimester include:  Blood tests that check for low iron levels (anemia).  Fetal testing to check the health, activity level, and growth of the fetus. Testing is done if you have certain medical conditions or if there are problems during the pregnancy.  Nonstress test  (NST). This test checks the health of your baby to make sure there are no signs of problems, such as the baby not getting enough oxygen. During this test, a belt is placed around your belly. The baby is made to move, and its heart rate is monitored during movement. What is false labor? False labor is a condition in which you feel small, irregular tightenings of the muscles in the womb (contractions) that usually go away with rest, changing position, or drinking water. These are called Braxton Hicks contractions. Contractions may last for hours, days, or even weeks before true labor sets in. If contractions come at regular intervals, become more frequent, increase in intensity, or become painful, you should see your health care provider. What are the signs of labor?  Abdominal cramps.  Regular contractions that start at 10 minutes apart and become stronger and more frequent with time.  Contractions that start on the top of the uterus and spread down to the lower abdomen and back.  Increased pelvic pressure and dull back pain.  A watery or bloody mucus discharge that comes from the vagina.  Leaking of amniotic fluid. This is also known as your "water breaking." It could be a slow trickle or a gush. Let your health care provider know if it has a color or strange odor. If you have any of these signs, call your health care provider right away, even if it is before your due date. Follow these instructions at home: Medicines  Follow your health care provider's instructions regarding medicine use. Specific medicines may be either safe or unsafe to take during pregnancy.  Take a prenatal vitamin that contains at least 600 micrograms (mcg) of folic acid.  If you develop constipation, try taking a stool softener if your health care provider approves. Eating and drinking   Eat a balanced diet that includes fresh fruits and vegetables, whole grains, good sources of protein such as meat, eggs, or tofu,  and low-fat dairy. Your health care provider will help you determine the amount of weight gain that is right for you.  Avoid raw meat and uncooked cheese. These carry germs that can cause birth defects in the baby.  If you have low calcium intake from food, talk to your health care provider about whether you should take a daily calcium supplement.  Eat four or five small meals rather than three large meals a day.  Limit foods that are high in fat and processed sugars, such as fried and sweet foods.  To prevent constipation: ? Drink enough fluid to keep your urine clear or pale yellow. ? Eat foods that are high in fiber, such as fresh fruits and vegetables, whole grains, and beans. Activity  Exercise only as directed by your health care provider. Most women can continue their usual exercise routine during pregnancy. Try to exercise for 30 minutes at least 5 days a week. Stop exercising if you experience uterine contractions.  Avoid heavy lifting.  Do   not exercise in extreme heat or humidity, or at high altitudes.  Wear low-heel, comfortable shoes.  Practice good posture.  You may continue to have sex unless your health care provider tells you otherwise. Relieving pain and discomfort  Take frequent breaks and rest with your legs elevated if you have leg cramps or low back pain.  Take warm sitz baths to soothe any pain or discomfort caused by hemorrhoids. Use hemorrhoid cream if your health care provider approves.  Wear a good support bra to prevent discomfort from breast tenderness.  If you develop varicose veins: ? Wear support pantyhose or compression stockings as told by your healthcare provider. ? Elevate your feet for 15 minutes, 3-4 times a day. Prenatal care  Write down your questions. Take them to your prenatal visits.  Keep all your prenatal visits as told by your health care provider. This is important. Safety  Wear your seat belt at all times when driving.  Make  a list of emergency phone numbers, including numbers for family, friends, the hospital, and police and fire departments. General instructions  Avoid cat litter boxes and soil used by cats. These carry germs that can cause birth defects in the baby. If you have a cat, ask someone to clean the litter box for you.  Do not travel far distances unless it is absolutely necessary and only with the approval of your health care provider.  Do not use hot tubs, steam rooms, or saunas.  Do not drink alcohol.  Do not use any products that contain nicotine or tobacco, such as cigarettes and e-cigarettes. If you need help quitting, ask your health care provider.  Do not use any medicinal herbs or unprescribed drugs. These chemicals affect the formation and growth of the baby.  Do not douche or use tampons or scented sanitary pads.  Do not cross your legs for long periods of time.  To prepare for the arrival of your baby: ? Take prenatal classes to understand, practice, and ask questions about labor and delivery. ? Make a trial run to the hospital. ? Visit the hospital and tour the maternity area. ? Arrange for maternity or paternity leave through employers. ? Arrange for family and friends to take care of pets while you are in the hospital. ? Purchase a rear-facing car seat and make sure you know how to install it in your car. ? Pack your hospital bag. ? Prepare the baby's nursery. Make sure to remove all pillows and stuffed animals from the baby's crib to prevent suffocation.  Visit your dentist if you have not gone during your pregnancy. Use a soft toothbrush to brush your teeth and be gentle when you floss. Contact a health care provider if:  You are unsure if you are in labor or if your water has broken.  You become dizzy.  You have mild pelvic cramps, pelvic pressure, or nagging pain in your abdominal area.  You have lower back pain.  You have persistent nausea, vomiting, or  diarrhea.  You have an unusual or bad smelling vaginal discharge.  You have pain when you urinate. Get help right away if:  Your water breaks before 37 weeks.  You have regular contractions less than 5 minutes apart before 37 weeks.  You have a fever.  You are leaking fluid from your vagina.  You have spotting or bleeding from your vagina.  You have severe abdominal pain or cramping.  You have rapid weight loss or weight gain.  You have   shortness of breath with chest pain.  You notice sudden or extreme swelling of your face, hands, ankles, feet, or legs.  Your baby makes fewer than 10 movements in 2 hours.  You have severe headaches that do not go away when you take medicine.  You have vision changes. Summary  The third trimester is from week 28 through week 40, months 7 through 9. The third trimester is a time when the unborn baby (fetus) is growing rapidly.  During the third trimester, your discomfort may increase as you and your baby continue to gain weight. You may have abdominal, leg, and back pain, sleeping problems, and an increased need to urinate.  During the third trimester your breasts will keep growing and they will continue to become tender. A yellow fluid (colostrum) may leak from your breasts. This is the first milk you are producing for your baby.  False labor is a condition in which you feel small, irregular tightenings of the muscles in the womb (contractions) that eventually go away. These are called Braxton Hicks contractions. Contractions may last for hours, days, or even weeks before true labor sets in.  Signs of labor can include: abdominal cramps; regular contractions that start at 10 minutes apart and become stronger and more frequent with time; watery or bloody mucus discharge that comes from the vagina; increased pelvic pressure and dull back pain; and leaking of amniotic fluid. This information is not intended to replace advice given to you by your  health care provider. Make sure you discuss any questions you have with your health care provider. Document Revised: 04/30/2018 Document Reviewed: 02/13/2016 Elsevier Patient Education  2020 [employer]  

## 2019-07-20 LAB — GC/CHLAMYDIA PROBE AMP (~~LOC~~) NOT AT ARMC
Chlamydia: NEGATIVE
Comment: NEGATIVE
Comment: NORMAL
Neisseria Gonorrhea: NEGATIVE

## 2019-07-22 LAB — CULTURE, BETA STREP (GROUP B ONLY): Strep Gp B Culture: POSITIVE — AB

## 2019-07-27 ENCOUNTER — Ambulatory Visit: Payer: PRIVATE HEALTH INSURANCE | Admitting: *Deleted

## 2019-07-27 ENCOUNTER — Ambulatory Visit: Payer: PRIVATE HEALTH INSURANCE | Attending: Obstetrics and Gynecology

## 2019-07-27 ENCOUNTER — Other Ambulatory Visit: Payer: Self-pay

## 2019-07-27 DIAGNOSIS — O09513 Supervision of elderly primigravida, third trimester: Secondary | ICD-10-CM

## 2019-07-27 DIAGNOSIS — O3413 Maternal care for benign tumor of corpus uteri, third trimester: Secondary | ICD-10-CM | POA: Diagnosis not present

## 2019-07-27 DIAGNOSIS — Z3401 Encounter for supervision of normal first pregnancy, first trimester: Secondary | ICD-10-CM

## 2019-07-27 DIAGNOSIS — D259 Leiomyoma of uterus, unspecified: Secondary | ICD-10-CM | POA: Diagnosis not present

## 2019-07-27 DIAGNOSIS — Z3A36 36 weeks gestation of pregnancy: Secondary | ICD-10-CM

## 2019-07-27 DIAGNOSIS — Z362 Encounter for other antenatal screening follow-up: Secondary | ICD-10-CM | POA: Diagnosis not present

## 2019-07-28 ENCOUNTER — Telehealth: Payer: Self-pay

## 2019-07-28 NOTE — Telephone Encounter (Signed)
Pt called the after hours nurse line stating her BP was 135/104. Called pt and left message for pt to call the office back.  Joselyn Edling l Camyah Pultz, CMA

## 2019-07-29 ENCOUNTER — Encounter: Payer: PRIVATE HEALTH INSURANCE | Admitting: Family Medicine

## 2019-08-05 ENCOUNTER — Other Ambulatory Visit: Payer: Self-pay

## 2019-08-05 ENCOUNTER — Ambulatory Visit (INDEPENDENT_AMBULATORY_CARE_PROVIDER_SITE_OTHER): Payer: PRIVATE HEALTH INSURANCE | Admitting: Family Medicine

## 2019-08-05 VITALS — BP 120/79 | HR 85 | Wt 187.0 lb

## 2019-08-05 DIAGNOSIS — O09513 Supervision of elderly primigravida, third trimester: Secondary | ICD-10-CM

## 2019-08-05 DIAGNOSIS — Z3A38 38 weeks gestation of pregnancy: Secondary | ICD-10-CM

## 2019-08-05 DIAGNOSIS — O09519 Supervision of elderly primigravida, unspecified trimester: Secondary | ICD-10-CM

## 2019-08-05 DIAGNOSIS — Z3401 Encounter for supervision of normal first pregnancy, first trimester: Secondary | ICD-10-CM

## 2019-08-05 DIAGNOSIS — O3413 Maternal care for benign tumor of corpus uteri, third trimester: Secondary | ICD-10-CM

## 2019-08-05 DIAGNOSIS — O3410 Maternal care for benign tumor of corpus uteri, unspecified trimester: Secondary | ICD-10-CM

## 2019-08-05 DIAGNOSIS — D259 Leiomyoma of uterus, unspecified: Secondary | ICD-10-CM

## 2019-08-05 NOTE — Progress Notes (Signed)
   PRENATAL VISIT NOTE  Subjective:  Tracy Combs is a 34 y.o. G1P0 at [redacted]w[redacted]d being seen today for ongoing prenatal care.  She is currently monitored for the following issues for this high-risk pregnancy and has Encounter for supervision of normal first pregnancy in first trimester; AMA (advanced maternal age) primigravida 51+; Family history of ovarian cancer; Intervertebral disc disorders with radiculopathy, lumbar region; and Uterine fibroid in pregnancy on their problem list.  Patient reports no complaints.  Contractions: Not present. Vag. Bleeding: None.  Movement: Present. Denies leaking of fluid.   The following portions of the patient's history were reviewed and updated as appropriate: allergies, current medications, past family history, past medical history, past social history, past surgical history and problem list.   Objective:   Vitals:   08/05/19 1100  BP: 120/79  Pulse: 85  Weight: 187 lb (84.8 kg)    Fetal Status: Fetal Heart Rate (bpm): 140 Fundal Height: 37 cm Movement: Present     General:  Alert, oriented and cooperative. Patient is in no acute distress.  Skin: Skin is warm and dry. No rash noted.   Cardiovascular: Normal heart rate noted  Respiratory: Normal respiratory effort, no problems with respiration noted  Abdomen: Soft, gravid, appropriate for gestational age.  Pain/Pressure: Present     Pelvic: Cervical exam deferred        Extremities: Normal range of motion.  Edema: Trace  Mental Status: Normal mood and affect. Normal behavior. Normal judgment and thought content.   Assessment and Plan:  Pregnancy: G1P0 at [redacted]w[redacted]d 1. Encounter for supervision of normal first pregnancy in first trimester FHT and FH normal  2. Uterine fibroid in pregnancy No obstruction to fetal head  3. Advanced maternal age, primigravida, antepartum  Preterm labor symptoms and general obstetric precautions including but not limited to vaginal bleeding, contractions, leaking of fluid  and fetal movement were reviewed in detail with the patient. Please refer to After Visit Summary for other counseling recommendations.   Return in about 1 week (around 08/12/2019) for OB f/u.  Future Appointments  Date Time Provider [place]  08/12/2019  2:00 PM Lavonia Drafts, MD [facility] None    Truett Mainland, DO

## 2019-08-07 ENCOUNTER — Other Ambulatory Visit: Payer: Self-pay

## 2019-08-07 ENCOUNTER — Inpatient Hospital Stay (HOSPITAL_COMMUNITY): Payer: PRIVATE HEALTH INSURANCE | Admitting: Anesthesiology

## 2019-08-07 ENCOUNTER — Encounter (HOSPITAL_COMMUNITY): Payer: Self-pay | Admitting: Obstetrics & Gynecology

## 2019-08-07 ENCOUNTER — Inpatient Hospital Stay (HOSPITAL_COMMUNITY)
Admission: AD | Admit: 2019-08-07 | Discharge: 2019-08-09 | DRG: 807 | Disposition: A | Discharge status: Home / Self Care | Payer: PRIVATE HEALTH INSURANCE | Attending: Obstetrics & Gynecology | Admitting: Obstetrics & Gynecology

## 2019-08-07 DIAGNOSIS — O4202 Full-term premature rupture of membranes, onset of labor within 24 hours of rupture: Secondary | ICD-10-CM | POA: Diagnosis not present

## 2019-08-07 DIAGNOSIS — O26893 Other specified pregnancy related conditions, third trimester: Secondary | ICD-10-CM | POA: Diagnosis present

## 2019-08-07 DIAGNOSIS — D259 Leiomyoma of uterus, unspecified: Secondary | ICD-10-CM | POA: Diagnosis present

## 2019-08-07 DIAGNOSIS — Z3A38 38 weeks gestation of pregnancy: Secondary | ICD-10-CM

## 2019-08-07 DIAGNOSIS — E669 Obesity, unspecified: Secondary | ICD-10-CM | POA: Diagnosis present

## 2019-08-07 DIAGNOSIS — O3413 Maternal care for benign tumor of corpus uteri, third trimester: Secondary | ICD-10-CM | POA: Diagnosis present

## 2019-08-07 DIAGNOSIS — O4292 Full-term premature rupture of membranes, unspecified as to length of time between rupture and onset of labor: Secondary | ICD-10-CM | POA: Diagnosis present

## 2019-08-07 DIAGNOSIS — Z3401 Encounter for supervision of normal first pregnancy, first trimester: Secondary | ICD-10-CM

## 2019-08-07 DIAGNOSIS — O358XX0 Maternal care for other (suspected) fetal abnormality and damage, not applicable or unspecified: Secondary | ICD-10-CM | POA: Diagnosis present

## 2019-08-07 DIAGNOSIS — Z20822 Contact with and (suspected) exposure to covid-19: Secondary | ICD-10-CM | POA: Diagnosis present

## 2019-08-07 DIAGNOSIS — O99824 Streptococcus B carrier state complicating childbirth: Secondary | ICD-10-CM

## 2019-08-07 DIAGNOSIS — O09513 Supervision of elderly primigravida, third trimester: Secondary | ICD-10-CM

## 2019-08-07 DIAGNOSIS — O99214 Obesity complicating childbirth: Secondary | ICD-10-CM | POA: Diagnosis present

## 2019-08-07 DIAGNOSIS — O3410 Maternal care for benign tumor of corpus uteri, unspecified trimester: Secondary | ICD-10-CM | POA: Diagnosis present

## 2019-08-07 DIAGNOSIS — O4290 Premature rupture of membranes, unspecified as to length of time between rupture and onset of labor, unspecified weeks of gestation: Secondary | ICD-10-CM | POA: Diagnosis present

## 2019-08-07 DIAGNOSIS — B951 Streptococcus, group B, as the cause of diseases classified elsewhere: Secondary | ICD-10-CM

## 2019-08-07 HISTORY — DX: Unspecified asthma, uncomplicated: J45.909

## 2019-08-07 LAB — TYPE AND SCREEN
ABO/RH(D): O POS
Antibody Screen: NEGATIVE

## 2019-08-07 LAB — CBC
HCT: 36.2 % (ref 36.0–46.0)
Hemoglobin: 12.1 g/dL (ref 12.0–15.0)
MCH: 33.1 pg (ref 26.0–34.0)
MCHC: 33.4 g/dL (ref 30.0–36.0)
MCV: 98.9 fL (ref 80.0–100.0)
Platelets: 161 10*3/uL (ref 150–400)
RBC: 3.66 MIL/uL — ABNORMAL LOW (ref 3.87–5.11)
RDW: 12.9 % (ref 11.5–15.5)
WBC: 8.8 10*3/uL (ref 4.0–10.5)
nRBC: 0 % (ref 0.0–0.2)

## 2019-08-07 LAB — POCT FERN TEST: POCT Fern Test: POSITIVE

## 2019-08-07 LAB — SARS CORONAVIRUS 2 BY RT PCR (HOSPITAL ORDER, PERFORMED IN ~~LOC~~ HOSPITAL LAB): SARS Coronavirus 2: NEGATIVE

## 2019-08-07 LAB — RPR: RPR Ser Ql: NONREACTIVE

## 2019-08-07 LAB — ABO/RH: ABO/RH(D): O POS

## 2019-08-07 MED ORDER — FENTANYL CITRATE (PF) 100 MCG/2ML IJ SOLN
100.0000 ug | INTRAMUSCULAR | Status: DC | PRN
  Administered 2019-08-07: 100 ug via INTRAVENOUS
  Filled 2019-08-07: qty 2

## 2019-08-07 MED ORDER — TERBUTALINE SULFATE 1 MG/ML IJ SOLN
0.2500 mg | Freq: Once | INTRAMUSCULAR | Status: AC | PRN
  Administered 2019-08-07: 0.25 mg via SUBCUTANEOUS
  Filled 2019-08-07: qty 1

## 2019-08-07 MED ORDER — SOD CITRATE-CITRIC ACID 500-334 MG/5ML PO SOLN: 30.0000 mL | ORAL | Status: DC | PRN

## 2019-08-07 MED ORDER — OXYCODONE-ACETAMINOPHEN 5-325 MG PO TABS: 1.0000 | ORAL_TABLET | ORAL | Status: DC | PRN

## 2019-08-07 MED ORDER — DOCUSATE SODIUM 100 MG PO CAPS
100.0000 mg | ORAL_CAPSULE | Freq: Two times a day (BID) | ORAL | Status: DC
  Administered 2019-08-07 – 2019-08-09 (×3): 100 mg via ORAL
  Filled 2019-08-07 (×4): qty 1

## 2019-08-07 MED ORDER — CEFAZOLIN SODIUM-DEXTROSE 1-4 GM/50ML-% IV SOLN
1.0000 g | Freq: Three times a day (TID) | INTRAVENOUS | Status: DC
  Filled 2019-08-07 (×2): qty 50

## 2019-08-07 MED ORDER — OXYCODONE-ACETAMINOPHEN 5-325 MG PO TABS: 2.0000 | ORAL_TABLET | ORAL | Status: DC | PRN

## 2019-08-07 MED ORDER — LACTATED RINGERS IV SOLN: 500.0000 mL | Freq: Once | INTRAVENOUS | Status: DC

## 2019-08-07 MED ORDER — OXYTOCIN BOLUS FROM INFUSION
333.0000 mL | Freq: Once | INTRAVENOUS | Status: AC
  Administered 2019-08-07: 333 mL via INTRAVENOUS

## 2019-08-07 MED ORDER — COCONUT OIL OIL: 1.0000 "application " | TOPICAL_OIL | Status: DC | PRN

## 2019-08-07 MED ORDER — MISOPROSTOL 50MCG HALF TABLET
50.0000 ug | ORAL_TABLET | ORAL | Status: DC | PRN
  Administered 2019-08-07: 50 ug via BUCCAL
  Filled 2019-08-07: qty 1

## 2019-08-07 MED ORDER — DIPHENHYDRAMINE HCL 50 MG/ML IJ SOLN: 12.5000 mg | INTRAMUSCULAR | Status: DC | PRN

## 2019-08-07 MED ORDER — LACTATED RINGERS IV SOLN: 500.0000 mL | INTRAVENOUS | Status: DC | PRN

## 2019-08-07 MED ORDER — LIDOCAINE HCL (PF) 1 % IJ SOLN
INTRAMUSCULAR | Status: DC | PRN
  Administered 2019-08-07 (×2): 4 mL via EPIDURAL

## 2019-08-07 MED ORDER — OXYTOCIN-SODIUM CHLORIDE 30-0.9 UT/500ML-% IV SOLN: 1.0000 m[IU]/min | INTRAVENOUS | Status: DC

## 2019-08-07 MED ORDER — EPHEDRINE 5 MG/ML INJ: 10.0000 mg | INTRAVENOUS | Status: DC | PRN

## 2019-08-07 MED ORDER — SIMETHICONE 80 MG PO CHEW: 80.0000 mg | CHEWABLE_TABLET | ORAL | Status: DC | PRN

## 2019-08-07 MED ORDER — SODIUM CHLORIDE (PF) 0.9 % IJ SOLN
INTRAMUSCULAR | Status: DC | PRN
  Administered 2019-08-07: 12 mL/h via EPIDURAL

## 2019-08-07 MED ORDER — MEASLES, MUMPS & RUBELLA VAC IJ SOLR: 0.5000 mL | Freq: Once | INTRAMUSCULAR | Status: DC

## 2019-08-07 MED ORDER — TETANUS-DIPHTH-ACELL PERTUSSIS 5-2.5-18.5 LF-MCG/0.5 IM SUSP: 0.5000 mL | Freq: Once | INTRAMUSCULAR | Status: DC

## 2019-08-07 MED ORDER — ONDANSETRON HCL 4 MG/2ML IJ SOLN: 4.0000 mg | INTRAMUSCULAR | Status: DC | PRN

## 2019-08-07 MED ORDER — LACTATED RINGERS IV SOLN: INTRAVENOUS | Status: DC

## 2019-08-07 MED ORDER — SENNOSIDES-DOCUSATE SODIUM 8.6-50 MG PO TABS
2.0000 | ORAL_TABLET | ORAL | Status: DC
  Administered 2019-08-07 – 2019-08-08 (×2): 2 via ORAL
  Filled 2019-08-07 (×2): qty 2

## 2019-08-07 MED ORDER — IBUPROFEN 600 MG PO TABS
600.0000 mg | ORAL_TABLET | Freq: Four times a day (QID) | ORAL | Status: DC
  Administered 2019-08-07 – 2019-08-09 (×8): 600 mg via ORAL
  Filled 2019-08-07 (×8): qty 1

## 2019-08-07 MED ORDER — PHENYLEPHRINE 40 MCG/ML (10ML) SYRINGE FOR IV PUSH (FOR BLOOD PRESSURE SUPPORT)
80.0000 ug | PREFILLED_SYRINGE | INTRAVENOUS | Status: DC | PRN
  Filled 2019-08-07: qty 10

## 2019-08-07 MED ORDER — ONDANSETRON HCL 4 MG/2ML IJ SOLN: 4.0000 mg | Freq: Four times a day (QID) | INTRAMUSCULAR | Status: DC | PRN

## 2019-08-07 MED ORDER — LACTATED RINGERS IV SOLN
500.0000 mL | Freq: Once | INTRAVENOUS | Status: AC
  Administered 2019-08-07: 500 mL via INTRAVENOUS

## 2019-08-07 MED ORDER — ZOLPIDEM TARTRATE 5 MG PO TABS: 5.0000 mg | ORAL_TABLET | Freq: Every evening | ORAL | Status: DC | PRN

## 2019-08-07 MED ORDER — ACETAMINOPHEN 325 MG PO TABS: 650.0000 mg | ORAL_TABLET | ORAL | Status: DC | PRN

## 2019-08-07 MED ORDER — FENTANYL-BUPIVACAINE-NACL 0.5-0.125-0.9 MG/250ML-% EP SOLN
12.0000 mL/h | EPIDURAL | Status: DC | PRN
  Filled 2019-08-07: qty 250

## 2019-08-07 MED ORDER — FLEET ENEMA 7-19 GM/118ML RE ENEM: 1.0000 | ENEMA | RECTAL | Status: DC | PRN

## 2019-08-07 MED ORDER — PHENYLEPHRINE 40 MCG/ML (10ML) SYRINGE FOR IV PUSH (FOR BLOOD PRESSURE SUPPORT): 80.0000 ug | PREFILLED_SYRINGE | INTRAVENOUS | Status: DC | PRN

## 2019-08-07 MED ORDER — OXYTOCIN-SODIUM CHLORIDE 30-0.9 UT/500ML-% IV SOLN
2.5000 [IU]/h | INTRAVENOUS | Status: DC
  Filled 2019-08-07: qty 500

## 2019-08-07 MED ORDER — WITCH HAZEL-GLYCERIN EX PADS: 1.0000 "application " | MEDICATED_PAD | CUTANEOUS | Status: DC | PRN

## 2019-08-07 MED ORDER — DIBUCAINE (PERIANAL) 1 % EX OINT: 1.0000 "application " | TOPICAL_OINTMENT | CUTANEOUS | Status: DC | PRN

## 2019-08-07 MED ORDER — DIPHENHYDRAMINE HCL 25 MG PO CAPS: 25.0000 mg | ORAL_CAPSULE | Freq: Four times a day (QID) | ORAL | Status: DC | PRN

## 2019-08-07 MED ORDER — ONDANSETRON HCL 4 MG PO TABS: 4.0000 mg | ORAL_TABLET | ORAL | Status: DC | PRN

## 2019-08-07 MED ORDER — CEFAZOLIN SODIUM-DEXTROSE 2-4 GM/100ML-% IV SOLN
2.0000 g | Freq: Once | INTRAVENOUS | Status: AC
  Administered 2019-08-07: 2 g via INTRAVENOUS
  Filled 2019-08-07: qty 100

## 2019-08-07 MED ORDER — LIDOCAINE HCL (PF) 1 % IJ SOLN
30.0000 mL | INTRAMUSCULAR | Status: DC | PRN
  Filled 2019-08-07: qty 30

## 2019-08-07 MED ORDER — BENZOCAINE-MENTHOL 20-0.5 % EX AERO: 1.0000 "application " | INHALATION_SPRAY | CUTANEOUS | Status: DC | PRN

## 2019-08-07 MED ORDER — PRENATAL MULTIVITAMIN CH
1.0000 | ORAL_TABLET | Freq: Every day | ORAL | Status: DC
  Administered 2019-08-07 – 2019-08-09 (×3): 1 via ORAL
  Filled 2019-08-07 (×3): qty 1

## 2019-08-07 NOTE — Progress Notes (Signed)
SVD baby boy, to warmer for assessment from NICU team

## 2019-08-07 NOTE — MAU Note (Signed)
Tracy Combs is a 34 y.o. at [redacted]w[redacted]d here in MAU reporting: got up around 0600 and felt a gush, went to the bathroom and saw some pink/brown discharge. Has felt a little bit of leaking. Having some cramping. +FM. No recent IC.  Onset of complaint: today  Pain score: 1/10  Vitals:   08/07/19 0724  BP: 131/84  Pulse: 89  Resp: 16  Temp: 98.1 F (36.7 C)  SpO2: 98%     FHT: +FM  Lab orders placed from triage: none

## 2019-08-07 NOTE — H&P (Signed)
OBSTETRIC ADMISSION HISTORY AND PHYSICAL  Tracy Combs is a 34 y.o. female G1P0 with IUP at [redacted]w[redacted]d by 11 wk [place] presenting for SROM at 0600. Reports some mild cramping since then. She reports +FMs, no VB, no blurry vision, headaches or peripheral edema, and RUQ pain.  She plans on breast feeding. She requests IUD outpatient for birth control. She received her prenatal care at [facility]   Dating: By 11 wk [place] --->  Estimated Date of Delivery: 08/19/19  Sono:  7/6  @[redacted]w[redacted]d , CWD, normal anatomy, cephalic presentation, anterior placental lie, 2785g, 31% EFW Myomas  Site                     L(cm)      W(cm)      D(cm)       Location  LUS                      8.18       7.9        7.4  Prenatal History/Complications: GBS positive Uterine fibroid  Past Medical History: Past Medical History:  Diagnosis Date  . Asthma    as a child   . Back pain     Past Surgical History: Past Surgical History:  Procedure Laterality Date  . NO PAST SURGERIES      Obstetrical History: OB History    Gravida  1   Para      Term      Preterm      AB      Living        SAB      TAB      Ectopic      Multiple      Live Births              Social History: Social History   Socioeconomic History  . Marital status: Married    Spouse name: Tracy Combs  . Number of children: 0  . Years of education: Not on file  . Highest education level: Doctorate  Occupational History  . Occupation: vet    Comment: [employer]  Tobacco Use  . Smoking status: Never Smoker  . Smokeless tobacco: Never Used  Vaping Use  . Vaping Use: Never used  Substance and Sexual Activity  . Alcohol use: Not Currently  . Drug use: Never  . Sexual activity: Yes    Birth control/protection: None  Other Topics Concern  . Not on file  Social History Narrative  . Not on file   Social Determinants of Health   Financial Resource Strain:   . Difficulty of Paying Living Expenses:   Food Insecurity:    . Worried About Charity fundraiser in the Last Year:   . Arboriculturist in the Last Year:   Transportation Needs:   . Film/video editor (Medical):   Marland Kitchen Lack of Transportation (Non-Medical):   Physical Activity:   . Days of Exercise per Week:   . Minutes of Exercise per Session:   Stress:   . Feeling of Stress :   Social Connections:   . Frequency of Communication with Friends and Family:   . Frequency of Social Gatherings with Friends and Family:   . Attends Religious Services:   . Active Member of Clubs or Organizations:   . Attends Archivist Meetings:   Marland Kitchen Marital Status:     Family History: Family History  Problem Relation Age of Onset  . Diabetes Mother   . Cancer Sister 61       half sister- ovarian  . Stroke Neg Hx   . Hypertension Neg Hx     Allergies: Allergies  Allergen Reactions  . Amoxicillin-Pot Clavulanate Rash    Medications Prior to Admission  Medication Sig Dispense Refill Last Dose  . cetirizine (ZYRTEC) 10 MG tablet Take 10 mg by mouth daily.   08/06/2019 at Unknown time  . cyclobenzaprine (FLEXERIL) 10 MG tablet Take 1 tablet (10 mg total) by mouth every 8 (eight) hours as needed for muscle spasms. 30 tablet 2 08/06/2019 at Unknown time  . diphenhydrAMINE HCl (BENADRYL PO) Take by mouth.   08/06/2019 at Unknown time  . famotidine (PEPCID) 20 MG tablet Take 20 mg by mouth 2 (two) times daily.    08/06/2019 at Unknown time  . Prenatal Vit-Fe Fumarate-FA (MULTIVITAMIN-PRENATAL) 27-0.8 MG TABS tablet Take 1 tablet by mouth daily at 12 noon.   08/06/2019 at Unknown time  . acetaminophen (TYLENOL) 500 MG tablet Take 500 mg by mouth every 6 (six) hours as needed.     . Calcium Carbonate Antacid (TUMS PO) Take by mouth.     . Elastic Bandages & Supports (COMFORT FIT MATERNITY SUPP LG) MISC 1 Units by Does not apply route daily. 1 each 0      Review of Systems   All systems reviewed and negative except as stated in HPI  Blood pressure  131/84, pulse 89, temperature 98.1 F (36.7 C), temperature source Oral, resp. rate 16, height 5\' 1"  (1.549 m), weight 85 kg, last menstrual period 11/12/2018, SpO2 98 %. General appearance: alert, cooperative, appears stated age and no distress Lungs: normal effort Heart: regular rate  Abdomen: soft, non-tender; bowel sounds normal Pelvic: gravid uterus GU: No vaginal lesions  Extremities: Homans sign is negative, no sign of DVT Presentation: cephalic Fetal monitoringBaseline: 150 bpm, Variability: Good {> 6 bpm), Accelerations: Non-reactive but appropriate for gestational age and Decelerations: Absent Uterine activity: None Dilation: 1.5 Effacement (%): 80 Exam by:: Pattiann Solanki MD   Prenatal labs: ABO, Rh: --/--/PENDING (07/17 2595) Antibody: PENDING (07/17 6387) Rubella: 7.46 (01/12 1039) RPR: Non Reactive (05/04 0926)  HBsAg: Negative (01/12 1039)  HIV: Non Reactive (05/04 0926)  GBS: Positive/-- (06/28 0941)  2 hr Glucola WNL Genetic screening  Low risk female Anatomy US normal except for choroid plexus cyst  Prenatal Transfer Tool  Maternal Diabetes: No Genetic Screening: Normal Maternal Ultrasounds/Referrals: Normal except for choroid plexus cyst Fetal Ultrasounds or other Referrals:  None Maternal Substance Abuse:  No Significant Maternal Medications:  None Significant Maternal Lab Results: Group B Strep positive  Results for orders placed or performed during the hospital encounter of 08/07/19 (from the past 24 hour(s))  Fern Test   Collection Time: 08/07/19  7:58 AM  Result Value Ref Range   POCT Fern Test Positive = ruptured amniotic membanes   CBC   Collection Time: 08/07/19  8:24 AM  Result Value Ref Range   WBC 8.8 4.0 - 10.5 K/uL   RBC 3.66 (L) 3.87 - 5.11 MIL/uL   Hemoglobin 12.1 12.0 - 15.0 g/dL   HCT 36.2 36 - 46 %   MCV 98.9 80.0 - 100.0 fL   MCH 33.1 26.0 - 34.0 pg   MCHC 33.4 30.0 - 36.0 g/dL   RDW 12.9 11.5 - 15.5 %   Platelets 161 150 - 400 K/uL    nRBC 0.0 0.0 -  0.2 %  Type and screen [place]   Collection Time: 08/07/19  8:24 AM  Result Value Ref Range   ABO/RH(D) PENDING    Antibody Screen PENDING    Sample Expiration      08/10/2019,2359 Performed at [facility], [place] [address], [place], [place] [address]     Patient Active Problem List   Diagnosis Date Noted  . PROM (premature rupture of membranes) 08/07/2019  . Positive GBS test 08/07/2019  . Uterine fibroid in pregnancy 03/23/2019  . Encounter for supervision of normal first pregnancy in first trimester 02/02/2019  . AMA (advanced maternal age) primigravida 35+ 02/02/2019  . Family history of ovarian cancer 09/11/2017  . Intervertebral disc disorders with radiculopathy, lumbar region 11/24/2015    Assessment/Plan:  Floy Angert is a 34 y.o. G1P0 at [redacted]w[redacted]d here for SROM, possibly early labor.  #Labor: Vertex by exam. Shared decision making, will give Cytotec as only contracting mildly. Will try to avoid Foley bulb as ruptured, consider if not progressing in next several hours. Pit PRN. Anticipate SVD. #Pain: Per patient request #FWB: Cat I; EFW: 3200g #ID:  GBS pos; Ancef #MOF: Breast #MOC: IUD outpatient (private insurance) #Circ:  Inpatient   Barrington Ellison, MD [facility] Medicine Fellow, [facility] for [facility], [facility] 08/07/2019, 9:17 AM

## 2019-08-07 NOTE — Discharge Summary (Signed)
Postpartum Discharge Summary    Patient Name: Tracy Combs DOB: [birth year] MRN: [id number]  Date of admission: 08/07/2019 Delivery date:08/07/2019  Delivering provider: Chauncey Mann  Date of discharge: 08/09/2019  Admitting diagnosis: PROM (premature rupture of membranes) [O42.90] Intrauterine pregnancy: [redacted]w[redacted]d    Secondary diagnosis:  Active Problems:   Encounter for supervision of normal first pregnancy in first trimester   AMA (advanced maternal age) primigravida 35+   Uterine fibroid in pregnancy   PROM (premature rupture of membranes)   Positive GBS test   Vacuum-assisted vaginal delivery  Additional problems: None    Discharge diagnosis: Term Pregnancy Delivered                                              Post partum procedures:None Augmentation: Cytotec Complications: None  Hospital course: Onset of Labor With Vaginal Delivery      34y.o. yo G1P0 at 384w2das admitted in Latent Labor on 08/07/2019. Patient had an uncomplicated labor course as follows: Arrived after SROM. Initial SVE: 1.5/80/-2. Patient received one Cytotec and quickly progressed to complete. Due to fetal bradycardia, vacuum-extractor utilized. Membrane Rupture Time/Date: 6:00 AM ,08/07/2019   Delivery Method:Vaginal, Vacuum (Extractor)  Episiotomy: None  Lacerations:  1st degree  Patient had an uncomplicated postpartum course.  She is ambulating, tolerating a regular diet, passing flatus, and urinating well. Patient is discharged home in stable condition on 08/09/19.  Newborn Data: Birth date:08/07/2019  Birth time:12:09 PM  Gender:Female  Living status:Living  Apgars:8 ,9  Weight:2750 g   Magnesium Sulfate received: No BMZ received: No Rhophylac:N/A MMR:N/A T-DaP:Given prenatally Flu: No Transfusion:No  Physical exam  Vitals:   08/08/19 0541 08/08/19 1341 08/08/19 2104 08/09/19 0512  BP: 114/71 114/63 124/72 115/65  Pulse: 87 86 91 79  Resp: '18 17 16 17  ' Temp: 98 F (36.7 C) 98.1 F (36.7  C) 98.9 F (37.2 C) 98.3 F (36.8 C)  TempSrc:  Oral Oral Oral  SpO2:  99% 99% 100%  Weight:      Height:       General: alert, cooperative and no distress Lochia: appropriate Uterine Fundus: firm Incision: N/A DVT Evaluation: No evidence of DVT seen on physical exam. No significant calf/ankle edema. Labs: Lab Results  Component Value Date   WBC 8.8 08/07/2019   HGB 12.1 08/07/2019   HCT 36.2 08/07/2019   MCV 98.9 08/07/2019   PLT 161 08/07/2019   No flowsheet data found. Edinburgh Score: Edinburgh Postnatal Depression Scale Screening Tool 08/07/2019  I have been able to laugh and see the funny side of things. (No Data)     After visit meds:  Allergies as of 08/09/2019      Reactions   Amoxicillin-pot Clavulanate Rash      Medication List    STOP taking these medications   cyclobenzaprine 10 MG tablet Commonly known as: FLEXERIL     TAKE these medications   acetaminophen 500 MG tablet Commonly known as: TYLENOL Take 500 mg by mouth every 6 (six) hours as needed for mild pain or headache.   BENADRYL PO Take 1 capsule by mouth at bedtime.   cetirizine 10 MG tablet Commonly known as: ZYRTEC Take 10 mg by mouth daily.   Comfort Fit Maternity Supp Lg Misc 1 Units by Does not apply route daily.   famotidine 20 MG tablet Commonly known  as: PEPCID Take 20 mg by mouth 2 (two) times daily.   ibuprofen 600 MG tablet Commonly known as: ADVIL Take 1 tablet (600 mg total) by mouth every 6 (six) hours.   multivitamin-prenatal 27-0.8 MG Tabs tablet Take 1 tablet by mouth daily at 12 noon.   polyethylene glycol powder 17 GM/SCOOP powder Commonly known as: GLYCOLAX/MIRALAX Take 17 g by mouth daily as needed.        Discharge home in stable condition Infant Feeding: Breast Infant Disposition:home with mother Discharge instruction: per After Visit Summary and Postpartum booklet. Activity: Advance as tolerated. Pelvic rest for 6 weeks.  Diet: routine  diet Future Appointments: Future Appointments  Date Time Provider Arthur  09/09/2019 10:30 AM Truett Mainland, DO [facility] None   Follow up Visit:   Please schedule this patient for a In person postpartum visit in 4 weeks with the following provider: Any provider. Additional Postpartum F/U:IUD placement  Low risk pregnancy complicated by: none Delivery mode:  Vaginal, Vacuum (Extractor)  Anticipated Birth Control:  IUD   08/09/2019 Clarnce Flock, MD

## 2019-08-07 NOTE — Anesthesia Preprocedure Evaluation (Signed)
Anesthesia Evaluation  Patient identified by MRN, date of birth, ID band Patient awake    Reviewed: Allergy & Precautions, Patient's Chart, lab work & pertinent test results  Airway Mallampati: II  TM Distance: >3 FB Neck ROM: Full    Dental no notable dental hx. (+) Teeth Intact   Pulmonary asthma ,  As a child   Pulmonary exam normal breath sounds clear to auscultation       Cardiovascular negative cardio ROS Normal cardiovascular exam Rhythm:Regular Rate:Normal     Neuro/Psych  Neuromuscular disease negative psych ROS   GI/Hepatic Neg liver ROS, GERD  Medicated and Controlled,  Endo/Other  Obesity   Renal/GU negative Renal ROS  negative genitourinary   Musculoskeletal Low back pain with radiculopathy   Abdominal (+) + obese,   Peds  Hematology negative hematology ROS (+)   Anesthesia Other Findings   Reproductive/Obstetrics (+) Pregnancy                             Anesthesia Physical Anesthesia Plan  ASA: II  Anesthesia Plan: Epidural   Post-op Pain Management:    Induction:   PONV Risk Score and Plan:   Airway Management Planned: Natural Airway  Additional Equipment:   Intra-op Plan:   Post-operative Plan:   Informed Consent: I have reviewed the patients History and Physical, chart, labs and discussed the procedure including the risks, benefits and alternatives for the proposed anesthesia with the patient or authorized representative who has indicated his/her understanding and acceptance.       Plan Discussed with: Anesthesiologist  Anesthesia Plan Comments:         Anesthesia Quick Evaluation

## 2019-08-07 NOTE — Lactation Note (Signed)
This note was copied from a baby's chart. Lactation Consultation Note  Patient Name: Tracy Combs Date: 08/07/2019 Reason for consult: Initial assessment;1st time breastfeeding;Early term 37-38.6wks P1, 7 hour ETI female infant. Per mom, infant had two stools since birth. This infant's first time latching at breast, infant did not latch in L&D. Mom has been doing a lot of STS. LC entered room,  RN Colletta [place]) help mom with latch, mom was breastfeeding infant using the football hold position on her left breast, infant's nose and chin was touching, top lip flanged out, swallows could be heard. Infant BF without difficulty for 15 minutes and mom's nipples were rounded when infant came off the breast. LC discussed hand expression and infant was given 2 mls of EBM by spoon. Mom knows to BF according to hunger cues, on demand, 8 to 12+ times within 24 hours. LC gave mom a hand pump due to not having one at home. Mom shown how to use hand pump & how to disassemble, clean, & reassemble parts. LC explained hand pump is for short term use and that if she is more than 4 hours away from infant she would need to purchase a DEBP hospital grade. Mom knows to call RN or [place] if she needs assistance with latching infant at breast. Mom made aware of O/P services, breastfeeding support groups, community resources, and our phone # for post-discharge questions.    Maternal Data Formula Feeding for Exclusion: Yes Reason for exclusion: Mother's choice to formula and breast feed on admission Has patient been taught Hand Expression?: Yes Does the patient have breastfeeding experience prior to this delivery?: No  Feeding Feeding Type: Breast Fed  LATCH Score Latch: Repeated attempts needed to sustain latch, nipple held in mouth throughout feeding, stimulation needed to elicit sucking reflex.  Audible Swallowing: A few with stimulation  Type of Nipple: Flat (everted after stimulation)  Comfort  (Breast/Nipple): Soft / non-tender  Hold (Positioning): Assistance needed to correctly position infant at breast and maintain latch.  LATCH Score: 6  Interventions Interventions: Breast feeding basics reviewed;Assisted with latch;Skin to skin;Breast massage;Hand express;Support pillows;Adjust position;Breast compression;Hand pump;Position options;Expressed milk  Lactation Tools Discussed/Used WIC Program: No Pump Review: Setup, frequency, and cleaning;Milk Storage Initiated by:: Alferd Patee, IBCLC Date initiated:: 08/08/19   Consult Status Consult Status: Follow-up Date: 08/08/19 Follow-up type: In-patient    Vicente Serene 08/07/2019, 7:17 PM

## 2019-08-07 NOTE — Anesthesia Procedure Notes (Signed)
Epidural Patient location during procedure: OB Start time: 08/07/2019 11:18 AM End time: 08/07/2019 11:25 AM  Staffing Anesthesiologist: Josephine Igo, MD Performed: anesthesiologist   Preanesthetic Checklist Completed: patient identified, IV checked, site marked, risks and benefits discussed, surgical consent, monitors and equipment checked, pre-op evaluation and timeout performed  Epidural Patient position: sitting Prep: DuraPrep and site prepped and draped Patient monitoring: continuous pulse ox and blood pressure Approach: midline Location: L3-L4 Injection technique: LOR air  Needle:  Needle type: Tuohy  Needle gauge: 17 G Needle length: 9 cm and 9 Needle insertion depth: 5 cm cm Catheter type: closed end flexible Catheter size: 19 Gauge Catheter at skin depth: 10 cm Test dose: negative and Other  Assessment Events: blood not aspirated, injection not painful, no injection resistance, no paresthesia and negative IV test  Additional Notes Patient identified. Risks and benefits discussed including failed block, incomplete  Pain control, post dural puncture headache, nerve damage, paralysis, blood pressure Changes, nausea, vomiting, reactions to medications-both toxic and allergic and post Partum back pain. All questions were answered. Patient expressed understanding and wished to proceed. Sterile technique was used throughout procedure. Epidural site was Dressed with sterile barrier dressing. No paresthesias, signs of intravascular injection Or signs of intrathecal spread were encountered.  Patient was more comfortable after the epidural was dosed. Please see RN's note for documentation of vital signs and FHR which are stable. Reason for block:procedure for pain

## 2019-08-07 NOTE — Progress Notes (Signed)
Pt feel;ing urge to push.  SVE 10 cm, pt pushing during ctx with movement noted

## 2019-08-08 NOTE — Progress Notes (Signed)
POSTPARTUM PROGRESS NOTE  Post Partum Day 1  Subjective:  Tracy Combs is a 34 y.o. G1P1001 s/p VAVD at [redacted]w[redacted]d.  She reports she is doing well. No acute events overnight. She denies any problems with ambulating, voiding or po intake. Denies nausea or vomiting.  Pain is well controlled.  Lochia is appropriate.  Objective: Blood pressure 114/71, pulse 87, temperature 98 F (36.7 C), resp. rate 18, height 5\' 1"  (1.549 m), weight 85 kg, last menstrual period 11/12/2018, SpO2 100 %, unknown if currently breastfeeding.  Physical Exam:  General: alert, cooperative and no distress Chest: no respiratory distress Heart:regular rate, distal pulses intact Abdomen: soft, nontender,  Uterine Fundus: firm, appropriately tender DVT Evaluation: No calf swelling or tenderness Extremities: no LE edema Skin: warm, dry  Recent Labs    08/07/19 0824  HGB 12.1  HCT 36.2    Assessment/Plan: Tracy Combs is a 34 y.o. G1P1001 s/p VAVD at [redacted]w[redacted]d   PPD#1 - Doing well  Routine postpartum care Contraception: IUD outpatient Feeding: breast Dispo: Plan for discharge PPD#2.   LOS: 1 day   Augustin Coupe, MD/[facility] OB Fellow  08/08/2019, 8:16 AM

## 2019-08-08 NOTE — Anesthesia Postprocedure Evaluation (Signed)
Anesthesia Post Note  Patient: Tracy Combs  Procedure(s) Performed: AN AD HOC LABOR EPIDURAL     Patient location during evaluation: Mother Baby Anesthesia Type: Epidural Level of consciousness: awake and alert Pain management: pain level controlled Vital Signs Assessment: post-procedure vital signs reviewed and stable Respiratory status: spontaneous breathing, nonlabored ventilation and respiratory function stable Cardiovascular status: stable Postop Assessment: no headache, no backache, epidural receding, no apparent nausea or vomiting, adequate PO intake, patient able to bend at knees and able to ambulate Anesthetic complications: no   No complications documented.  Last Vitals:  Vitals:   08/07/19 2111 08/08/19 0541  BP: 118/77 114/71  Pulse: 98 87  Resp: 18 18  Temp: 37.1 C 36.7 C  SpO2: 100%     Last Pain:  Vitals:   08/08/19 0541  TempSrc:   PainSc: 0-No pain   Pain Goal: Patients Stated Pain Goal: 2 (08/07/19 1500)                 [name]

## 2019-08-08 NOTE — Lactation Note (Signed)
This note was copied from a baby's chart. Lactation Consultation Note  Patient Name: Tracy Combs QIONG'E Date: 08/08/2019 Reason for consult: Follow-up assessment;Early term 37-38.6wks;Primapara;1st time breastfeeding  P1 mother whose infant is now 72 hours old.  This is an ETI at 38+2 weeks.    Reviewed breast feeding basics with mother including feeding cues, hand expression, latching and milk supply.  Mother had no specific questions/concerns related to breast feeding.  She will continue to feed 8-12 times/24 hours or sooner if baby shows feeding cues.  Colostrum container provided and milk storage times discussed.  Finger feeding demonstrated.  Encouraged to call RN/LC for latch assistance as needed.  Discussed the importance of a good latch and keeping baby awake and active during breast feeding.  Educated on gentle stimulation and mother has been able to see baby feed better with stimulation.  She will awaken by the third hour to feed if he does not self awaken.  Mother has a manual pump at bedside but does not have a DEBP at this time.  Explained the importance of obtaining a pump to help obtain and maintain a good milk supply.  Mother has private insurance.  I suggested she call her insurance company tomorrow morning to determine pump eligibility.  Mother interested in doing this.  Father present.     Maternal Data    Feeding Feeding Type: Breast Fed  LATCH Score                   Interventions    Lactation Tools Discussed/Used     Consult Status Consult Status: Follow-up Date: 08/09/19 Follow-up type: In-patient    Kandas Oliveto R Latrel Szymczak 08/08/2019, 4:03 PM

## 2019-08-09 MED ORDER — POLYETHYLENE GLYCOL 3350 17 GM/SCOOP PO POWD
17.0000 g | Freq: Every day | ORAL | 1 refills | Status: DC | PRN
Start: 2019-08-09 — End: 2019-09-09

## 2019-08-09 MED ORDER — IBUPROFEN 600 MG PO TABS: 600.0000 mg | ORAL_TABLET | Freq: Four times a day (QID) | ORAL | 0 refills | Status: DC

## 2019-08-09 NOTE — Lactation Note (Signed)
This note was copied from a baby's chart. Lactation Consultation Note  Patient Name: Tracy Combs JZPHX'T Date: 08/09/2019 Reason for consult: Follow-up assessment   Mother is a P83, infant is 71 hours old .  Marland Kitchen  Infant has been having short feeding with latch scores of 6-7. Mother has been spoon feeding small amts of ebm.  Mother has a positional strip on the left nipple. She was advised to phone OB for [facility] RX. Mother was given comfort gels.  Mother has a hand pump for use. She is still planning to phone insurance company about a pump.   Mother was observed with infant latched on at the right breast in cross cradle hold. Infant was observed with a tight jaw. Assist with adjusting lower jaw and flanging upper lip for wider gape. Mother reports that latch felt much better Observed infant suckling with audible swallows. Infant sustained latch for 10.  Mins.infant placed in football hold on same breast and latched again for another 10 mins. Mother reports that this is one of the best feedings that infant has had.  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 Infant rolls tongue to the top of his mouth and make for a difficult latch.   Lots of teaching with parents on proper latch . Father supportive and at the side of the chair for assistance.  Discussed treatment and prevention of engorgement.  Plan of Care : Breastfeed infant with feeding cues, discussed cluster feeding.  Advised mother to use hand pump after feedings and to feed back to infant any amt obtained. With  A spoon or curved tip syringe.   Mother advised to follow up with LC at peds office in one week.  Mother to continue to cue base feed infant and feed at least 8-12 times or more in 24 hours and advised to allow for cluster feeding infant as needed.   Mother to continue to due STS. Mother is aware of available LC services at [facility], BFSG'S, OP Dept, and phone # for questions or concerns about  breastfeeding.  Mother receptive to all teaching and plan of care.     Maternal Data    Feeding Feeding Type: Breast Fed  LATCH Score Latch: Grasps breast easily, tongue down, lips flanged, rhythmical sucking.  Audible Swallowing: Spontaneous and intermittent  Type of Nipple: Everted at rest and after stimulation  Comfort (Breast/Nipple): Soft / non-tender (right breast)  Hold (Positioning): Assistance needed to correctly position infant at breast and maintain latch.  LATCH Score: 9  Interventions    Lactation Tools Discussed/Used     Consult Status      Darla Lesches 08/09/2019, 10:59 AM

## 2019-08-09 NOTE — Discharge Instructions (Signed)

## 2019-08-12 ENCOUNTER — Encounter: Payer: PRIVATE HEALTH INSURANCE | Admitting: Obstetrics & Gynecology

## 2019-09-09 ENCOUNTER — Other Ambulatory Visit: Payer: Self-pay

## 2019-09-09 ENCOUNTER — Ambulatory Visit (INDEPENDENT_AMBULATORY_CARE_PROVIDER_SITE_OTHER): Payer: 59 | Admitting: Family Medicine

## 2019-09-09 ENCOUNTER — Encounter: Payer: Self-pay | Admitting: Family Medicine

## 2019-09-09 DIAGNOSIS — M9905 Segmental and somatic dysfunction of pelvic region: Secondary | ICD-10-CM | POA: Diagnosis not present

## 2019-09-09 DIAGNOSIS — M9904 Segmental and somatic dysfunction of sacral region: Secondary | ICD-10-CM | POA: Diagnosis not present

## 2019-09-09 DIAGNOSIS — S39012A Strain of muscle, fascia and tendon of lower back, initial encounter: Secondary | ICD-10-CM

## 2019-09-09 DIAGNOSIS — M9903 Segmental and somatic dysfunction of lumbar region: Secondary | ICD-10-CM

## 2019-09-09 NOTE — Progress Notes (Signed)
[facility] Partum Visit Note  Tracy Combs is a 34 y.o. G33P1001 female who presents for a postpartum visit. She is 4 weeks postpartum following a vacuum-assisted vaginal delivery.  I have fully reviewed the prenatal and intrapartum course. The delivery was at 23 gestational weeks.  Anesthesia: epidural. Postpartum course has been normal. Baby is doing well. Baby is feeding by both breast and bottle - Similac Advance. Bleeding staining only. Bowel function is normal. Bladder function is normal. Patient is not sexually active. Contraception method is IUD. Postpartum depression screening: negative.    Edinburgh Postnatal Depression Scale - 09/09/19 1034      Edinburgh Postnatal Depression Scale:  In the Past 7 Days   I have been able to laugh and see the funny side of things. 0    I have looked forward with enjoyment to things. 0    I have blamed myself unnecessarily when things went wrong. 0    I have been anxious or worried for no good reason. 0    I have felt scared or panicky for no good reason. 0    Things have been getting on top of me. 0    I have been so unhappy that I have had difficulty sleeping. 0    I have felt sad or miserable. 0    I have been so unhappy that I have been crying. 0    The thought of harming myself has occurred to me. 0    Edinburgh Postnatal Depression Scale Total 0         The following portions of the patient's history were reviewed and updated as appropriate: allergies, current medications, past family history, past medical history, past social history, past surgical history and problem list.  Review of Systems Pertinent items are noted in HPI.    Objective:  Blood pressure 108/86, pulse 96, height 5\' 1"  (1.549 m), weight 175 lb (79.4 kg), last menstrual period 11/12/2018, currently breastfeeding.  General:  alert, cooperative and no distress  Lungs: clear to auscultation bilaterally  Heart:  regular rate and rhythm, S1, S2 normal, no murmur, click, rub or  gallop  Abdomen: soft, non-tender; bowel sounds normal; no masses,  no organomegaly   MSK: Restriction, tenderness, tissue texture changes, and paraspinal spasm in the lumbar spine  Neuro: Moves all four extremities with no focal neurological deficit   OSE: Head   Cervical   Thoracic   Rib   Lumbar L1 ESRL, L5 ESRR  Sacrum L/L  Pelvis Right ant innom           Assessment:    normal postpartum exam. Pap smear not done at today's visit.   Plan:   Essential components of care per ACOG recommendations:  1.  Mood and well being: Patient with negative depression screening today. Reviewed local resources for support.  - Patient does not use tobacco.  - hx of drug use? No   2. Infant care and feeding:  -Patient currently breastmilk feeding? Yes  -Social determinants of health (SDOH) reviewed in EPIC. No concerns  3. Sexuality, contraception and birth spacing - Patient does not want a pregnancy in the next year.  - Reviewed forms of contraception in tiered fashion. Patient desired IUD. Will place in 2 weeks per patient's preference   - Discussed birth spacing of 18 months  4. Sleep and fatigue -Encouraged family/partner/community support of 4 hrs of uninterrupted sleep to help with mood and fatigue  5. Physical Recovery  - Discussed  patients delivery and complications - Patient has urinary incontinence? No - Patient is safe to resume physical and sexual activity  6.  Health Maintenance - Last pap smear done 01/2019 and was normal with negative HPV.  7. Back pain 8. SD lumbar, sacrum, pelvix OMT done after patient permission. HVLA technique utilized. 3 areas treated with improvement of tissue texture and joint mobility. Patient tolerated procedure well.    [place] for [employer], [place]

## 2019-09-23 ENCOUNTER — Other Ambulatory Visit: Payer: Self-pay

## 2019-09-23 ENCOUNTER — Ambulatory Visit (INDEPENDENT_AMBULATORY_CARE_PROVIDER_SITE_OTHER): Payer: 59 | Admitting: Family Medicine

## 2019-09-23 ENCOUNTER — Encounter: Payer: Self-pay | Admitting: Family Medicine

## 2019-09-23 VITALS — BP 109/68 | HR 79 | Ht 61.0 in | Wt 179.0 lb

## 2019-09-23 DIAGNOSIS — Z01812 Encounter for preprocedural laboratory examination: Secondary | ICD-10-CM | POA: Diagnosis not present

## 2019-09-23 DIAGNOSIS — Z30430 Encounter for insertion of intrauterine contraceptive device: Secondary | ICD-10-CM

## 2019-09-23 LAB — POCT URINE PREGNANCY: Preg Test, Ur: NEGATIVE

## 2019-09-23 MED ORDER — LEVONORGESTREL 19.5 MCG/DAY IU IUD
INTRAUTERINE_SYSTEM | Freq: Once | INTRAUTERINE | Status: AC
  Administered 2019-09-23: 1 via INTRAUTERINE

## 2019-09-23 NOTE — Progress Notes (Signed)
IUD Procedure Note Patient identified, informed consent performed, signed copy in chart, time out was performed.  Urine pregnancy test negative.  Speculum placed in the vagina.  Cervix visualized.  Cleaned with Betadine x 2.  Cervical block placed with Lidocaine 1% 91mL. Grasped anteriorly with a single tooth tenaculum.  Uterus sounded to 8 cm.  Liletta  IUD placed per manufacturer's recommendations.  Strings trimmed to 3 cm. Tenaculum was removed, good hemostasis noted.  Patient tolerated procedure well.   Patient given post procedure instructions and Liletta care card with expiration date.  Patient is asked to check IUD strings periodically and follow up in 4-6 weeks for IUD check.

## 2019-09-23 NOTE — Patient Instructions (Signed)

## 2019-10-21 ENCOUNTER — Ambulatory Visit (INDEPENDENT_AMBULATORY_CARE_PROVIDER_SITE_OTHER): Payer: 59 | Admitting: Family Medicine

## 2019-10-21 ENCOUNTER — Encounter: Payer: Self-pay | Admitting: Family Medicine

## 2019-10-21 ENCOUNTER — Other Ambulatory Visit: Payer: Self-pay

## 2019-10-21 VITALS — BP 101/62 | HR 82 | Ht 61.0 in | Wt 179.1 lb

## 2019-10-21 DIAGNOSIS — Z30431 Encounter for routine checking of intrauterine contraceptive device: Secondary | ICD-10-CM | POA: Diagnosis not present

## 2019-10-21 NOTE — Progress Notes (Signed)
   Subjective:   Patient Name: Tracy Combs, female   DOB: 24-Sep-1985, 34 y.o.  MRN: [id number]  HPI Patient here for an IUD check.  She had the [place] IUD placed 1 month ago.  She reports spotting.   Review of Systems  Constitutional: Negative for fever and chills.  Gastrointestinal: Negative for abdominal pain.  Genitourinary: Negative for vaginal discharge, vaginal pain, pelvic pain and dyspareunia.        Objective:   Physical Exam  Constitutional: She appears well-developed and well-nourished.  HENT:  Head: Normocephalic and atraumatic.  Abdominal: Soft. There is no tenderness. There is no guarding.  Genitourinary: There is no rash, tenderness or lesion on the right labia. There is no rash, tenderness or lesion on the left labia. No erythema or tenderness in the vagina. No foreign body around the vagina. No signs of injury around the vagina. No vaginal discharge found.    Skin: Skin is warm and dry.  Psychiatric: She has a normal mood and affect. Her behavior is normal. Judgment and thought content normal.       Assessment & Plan:  1. IUD check up IUD in place.  Pt to call with any other problems.  Recheck in 1 year.

## 2019-11-22 MED ORDER — MEGESTROL ACETATE 20 MG PO TABS: 20.0000 mg | ORAL_TABLET | Freq: Every day | ORAL | 0 refills | Status: AC

## 2020-01-14 ENCOUNTER — Emergency Department
Admission: RE | Admit: 2020-01-14 | Discharge: 2020-01-14 | Disposition: A | Discharge status: Home / Self Care | Payer: 59 | Source: Ambulatory Visit

## 2020-01-14 ENCOUNTER — Other Ambulatory Visit (HOSPITAL_COMMUNITY)
Admission: RE | Admit: 2020-01-14 | Discharge: 2020-01-14 | Disposition: A | Discharge status: Home / Self Care | Payer: 59 | Source: Ambulatory Visit | Attending: Family Medicine | Admitting: Family Medicine

## 2020-01-14 ENCOUNTER — Other Ambulatory Visit: Payer: Self-pay

## 2020-01-14 VITALS — BP 120/91 | HR 77 | Temp 98.5°F | Resp 18 | Ht 61.0 in | Wt 170.0 lb

## 2020-01-14 DIAGNOSIS — N898 Other specified noninflammatory disorders of vagina: Secondary | ICD-10-CM | POA: Diagnosis not present

## 2020-01-14 DIAGNOSIS — B379 Candidiasis, unspecified: Secondary | ICD-10-CM | POA: Diagnosis not present

## 2020-01-14 LAB — POCT URINALYSIS DIP (MANUAL ENTRY)
Bilirubin, UA: NEGATIVE
Blood, UA: NEGATIVE
Glucose, UA: NEGATIVE mg/dL
Ketones, POC UA: NEGATIVE mg/dL
Leukocytes, UA: NEGATIVE
Nitrite, UA: NEGATIVE
Protein Ur, POC: NEGATIVE mg/dL
Spec Grav, UA: 1.02 (ref 1.010–1.025)
Urobilinogen, UA: 0.2 E.U./dL
pH, UA: 6 (ref 5.0–8.0)

## 2020-01-14 MED ORDER — METRONIDAZOLE 500 MG PO TABS
500.0000 mg | ORAL_TABLET | Freq: Two times a day (BID) | ORAL | 0 refills | Status: DC
Start: 2020-01-14 — End: 2020-02-11

## 2020-01-14 MED ORDER — FLUCONAZOLE 150 MG PO TABS
150.0000 mg | ORAL_TABLET | Freq: Once | ORAL | 0 refills | Status: AC
Start: 2020-01-14 — End: 2020-01-14

## 2020-01-14 NOTE — ED Triage Notes (Signed)
Vaginal discharge and itching x 1 week.

## 2020-01-14 NOTE — ED Provider Notes (Signed)
[facility]    CSN: [id number] Arrival date & time: 01/14/20  1138      History   Chief Complaint Chief Complaint  Patient presents with  . Vaginal Itching    HPI Tracy Combs is a 34 y.o. female.   This is the initial [place] urgent care visit for this 34 year old woman with vaginal discomfort.  She called her OB/GYN doctor yesterday and left the following message: Hi I made an appointment for next week to have a vaginal swab due to odor and discharge and discomfort.  But over the past 24 hours  the most posterior portion of my vulva and vaginal opening has become uncomfortable and painful to the touch. This is the same area that I think I had my episoplasty post labor. I looked with a mirror and there is a painful red protruding nodule in this are, approx 0.5cm x 0.25cm. I dont know if you are open tomorrow,  but I don't think this should wait. If I don't hear back from you, I may just go [facility] in [place]. Please let me know if you can see me sooner.  Patient states that she often has a small amount of discharge but this is gotten much worse in the soreness in the posterior vagina is starting become irritated.  Patient works as a Animal nutritionist.  She has an IUD in place.  She is monogamous.     Past Medical History:  Diagnosis Date  . Asthma    as a child   . Back pain     Patient Active Problem List   Diagnosis Date Noted  . Uterine fibroid in pregnancy 03/23/2019  . Family history of ovarian cancer 09/11/2017  . Intervertebral disc disorders with radiculopathy, lumbar region 11/24/2015    Past Surgical History:  Procedure Laterality Date  . NO PAST SURGERIES      OB History    Gravida  1   Para  1   Term  1   Preterm      AB      Living  1     SAB      IAB      Ectopic      Multiple  0   Live Births  1            Home Medications    Prior to Admission medications   Medication Sig Start Date  End Date Taking? Authorizing Provider  cetirizine (ZYRTEC) 10 MG tablet Take 10 mg by mouth daily.    [provider]  fluconazole (DIFLUCAN) 150 MG tablet Take 1 tablet (150 mg total) by mouth once for 1 dose. Repeat if needed 01/14/20 01/14/20  Robyn Haber, MD  megestrol (MEGACE) 20 MG tablet Take 1 tablet (20 mg total) by mouth daily. Can increase to two tablets twice a day in the event of heavy bleeding 11/22/19   Truett Mainland, DO  metroNIDAZOLE (FLAGYL) 500 MG tablet Take 1 tablet (500 mg total) by mouth 2 (two) times daily. 01/14/20   Robyn Haber, MD  Prenatal Vit-Fe Fumarate-FA (MULTIVITAMIN-PRENATAL) 27-0.8 MG TABS tablet Take 1 tablet by mouth daily at 12 noon.    [provider]    Family History Family History  Problem Relation Age of Onset  . Diabetes Mother   . Cancer Sister 67       half sister- ovarian  . Stroke Neg Hx   . Hypertension Neg Hx  Social History Social History   Tobacco Use  . Smoking status: Never Smoker  . Smokeless tobacco: Never Used  Vaping Use  . Vaping Use: Never used  Substance Use Topics  . Alcohol use: Not Currently  . Drug use: Never     Allergies   Amoxicillin-pot clavulanate   Review of Systems Review of Systems   Physical Exam Triage Vital Signs ED Triage Vitals  Enc Vitals Group     BP      Pulse      Resp      Temp      Temp src      SpO2      Weight      Height      Head Circumference      Peak Flow      Pain Score      Pain Loc      Pain Edu?      Excl. in [place]?    No data found.  Updated Vital Signs BP (!) 120/91 (BP Location: Right Arm)   Pulse 77   Temp 98.5 F (36.9 C) (Oral)   Resp 18   Ht 5\' 1"  (1.549 m)   Wt 77.1 kg   SpO2 99%   BMI 32.12 kg/m    Physical Exam Vitals and nursing note reviewed.  Constitutional:      Appearance: Normal appearance. She is obese.  HENT:     Head: Normocephalic.  Eyes:     Conjunctiva/sclera: Conjunctivae normal.   Cardiovascular:     Rate and Rhythm: Normal rate.  Pulmonary:     Effort: Pulmonary effort is normal.  Genitourinary:    Comments: Hyperpigmented vulva with sharp demarcation suggesting yeast infection Musculoskeletal:        General: Normal range of motion.     Cervical back: Normal range of motion and neck supple.  Neurological:     General: No focal deficit present.     Mental Status: She is alert.  Psychiatric:        Mood and Affect: Mood normal.        Thought Content: Thought content normal.      UC Treatments / Results  Labs (all labs ordered are listed, but only abnormal results are displayed) Labs Reviewed  POCT URINALYSIS DIP (MANUAL ENTRY)    EKG   Radiology No results found.  Procedures Procedures (including critical care time)  Medications Ordered in UC Medications - No data to display  Initial Impression / Assessment and Plan / UC Course  I have reviewed the triage vital signs and the nursing notes.  Pertinent labs & imaging results that were available during my care of the patient were reviewed by me and considered in my medical decision making (see chart for details).    Final Clinical Impressions(s) / UC Diagnoses   Final diagnoses:  Vaginal itching  Monilia infection   Discharge Instructions   None    ED Prescriptions    Medication Sig Dispense Auth. Provider   fluconazole (DIFLUCAN) 150 MG tablet Take 1 tablet (150 mg total) by mouth once for 1 dose. Repeat if needed 2 tablet Robyn Haber, MD   metroNIDAZOLE (FLAGYL) 500 MG tablet Take 1 tablet (500 mg total) by mouth 2 (two) times daily. 14 tablet Robyn Haber, MD     I have reviewed the PDMP during this encounter.   Robyn Haber, MD 01/14/20 1221

## 2020-01-18 ENCOUNTER — Other Ambulatory Visit: Payer: 59

## 2020-01-18 LAB — CERVICOVAGINAL ANCILLARY ONLY
Bacterial Vaginitis (gardnerella): NEGATIVE
Candida Glabrata: NEGATIVE
Candida Vaginitis: NEGATIVE
Comment: NEGATIVE
Comment: NEGATIVE
Comment: NEGATIVE

## 2020-01-28 ENCOUNTER — Ambulatory Visit: Payer: 59 | Admitting: Family Medicine

## 2020-02-04 ENCOUNTER — Other Ambulatory Visit: Payer: Self-pay

## 2020-02-04 MED ORDER — FLUCONAZOLE 150 MG PO TABS: 150.0000 mg | ORAL_TABLET | Freq: Once | ORAL | 0 refills | Status: AC

## 2020-02-11 ENCOUNTER — Other Ambulatory Visit (HOSPITAL_COMMUNITY)
Admission: RE | Admit: 2020-02-11 | Discharge: 2020-02-11 | Disposition: A | Discharge status: Home / Self Care | Payer: 59 | Source: Ambulatory Visit | Attending: Obstetrics & Gynecology | Admitting: Obstetrics & Gynecology

## 2020-02-11 ENCOUNTER — Ambulatory Visit (INDEPENDENT_AMBULATORY_CARE_PROVIDER_SITE_OTHER): Payer: 59 | Admitting: Obstetrics & Gynecology

## 2020-02-11 ENCOUNTER — Encounter: Payer: Self-pay | Admitting: Obstetrics & Gynecology

## 2020-02-11 VITALS — BP 121/82 | HR 74

## 2020-02-11 DIAGNOSIS — N898 Other specified noninflammatory disorders of vagina: Secondary | ICD-10-CM

## 2020-02-11 DIAGNOSIS — J069 Acute upper respiratory infection, unspecified: Secondary | ICD-10-CM

## 2020-02-11 NOTE — Patient Instructions (Signed)
GO WHITE: Soap: UNSCENTED Dove (white box light green writing) Laundry detergent (underwear)- Dreft or [employer] unscented WHITE 100% cotton panties (NOT just cotton crouch) Sanitary napkin/panty liners: UNSCENTED.  If it doesn't SAY unscented it can have a scent/perfume    NO PERFUMES OR LOTIONS OR POTIONS in the vulvar area (may use regular [place]) Condoms: hypoallergenic only. Non dyed (no color) Toilet papers: white only Wash clothes: use a separate wash cloth. WHITE.  Washed in Dreft.  

## 2020-02-11 NOTE — Progress Notes (Signed)
Patient presents with reoccuring discharge. Patient states she was seen in urgent care on Christmas eve and nothing showed on her culture. Patient states diflucan did give some relief for a for days but then it comes back. Kathrene Alu RN

## 2020-02-11 NOTE — Progress Notes (Signed)
History:  35 y.o. G1P1001 here today for eval of vaginal irritation. Pt reports that for several weeks, she has had vaginal irritation and discharge. She initially had itching and swelling but, she was seen in the Urgent Care and was treated for Yeast. Her sx improved somewhat but returned to a milder degree soon after. She retreated with Diflucan but, still has some irritation and a copious amount of clear discharge with no odor. She denies pain. No abnormal bleeding. Pt also reports that she has had URI sx. Her son was in daycare and was sick. She has not been tested for COVID.   The following portions of the patient's history were reviewed and updated as appropriate: allergies, current medications, past family history, past medical history, past social history, past surgical history and problem list.  Review of Systems:  Pertinent items are noted in HPI.    Objective:  Physical Exam Blood pressure 121/82, pulse 74, currently breastfeeding.  CONSTITUTIONAL: Well-developed, well-nourished female in no acute distress.  HENT:  Normocephalic, atraumatic EYES: Conjunctivae and EOM are normal. No scleral icterus.  NECK: Normal range of motion SKIN: Skin is warm and dry. No rash noted. Not diaphoretic.No pallor. [place]: Alert and oriented to person, place, and time. Normal coordination.  Pelvic: Normal appearing external genitalia; normal appearing vaginal mucosa and cervix.  The discharge is is normal in appearance. No o  Assessment & Plan:  URI sx- rec COVID testing  Reviewed info on locating a site.   Vaginal irritation/discharge  Affirm sent  GO WHITE instructions reviewed.   Total face-to-face time with patient was 20 min.  Greater than 50% was spent in counseling and coordination of care with the patient.   Lennyx Verdell L. Harraway-Smith, M.D., Cherlynn June

## 2020-02-15 LAB — CERVICOVAGINAL ANCILLARY ONLY
Bacterial Vaginitis (gardnerella): NEGATIVE
Candida Glabrata: NEGATIVE
Candida Vaginitis: NEGATIVE
Comment: NEGATIVE
Comment: NEGATIVE
Comment: NEGATIVE

## 2022-01-29 IMAGING — US US MFM OB DETAIL+14 WK
1 series · 12 of 28 positions shown · non-contrast
Comparison: none

[Series 1: us mfm ob detail+14 wk · 83 acquisitions, 12 frames shown]
[im 4/83]
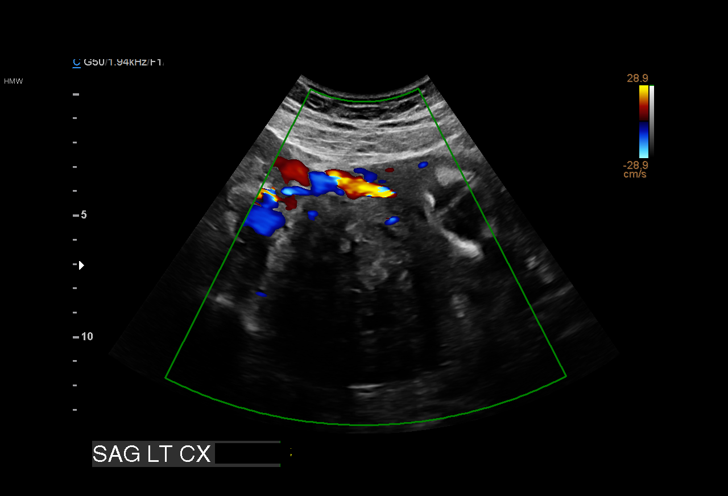
[im 10/83]
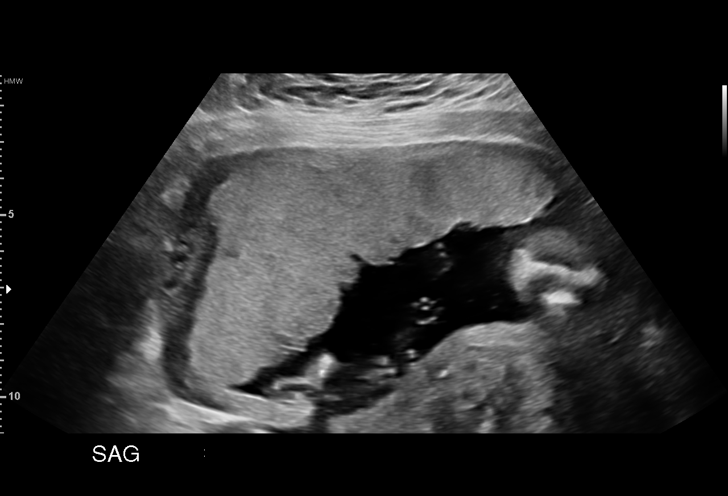
[im 16/83]
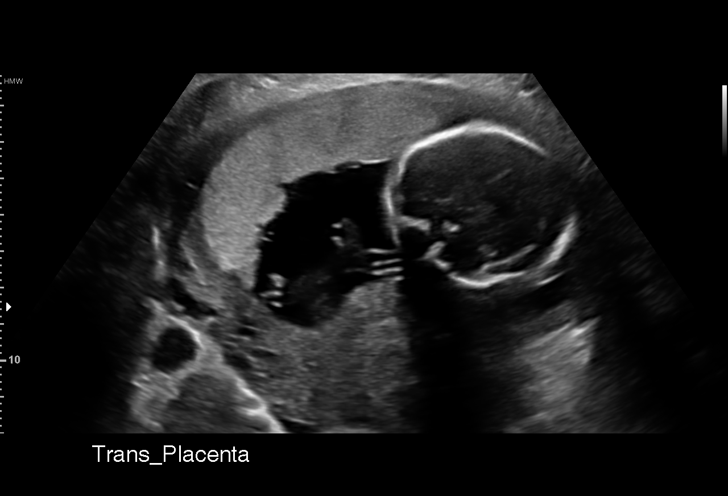
[im 25/83]
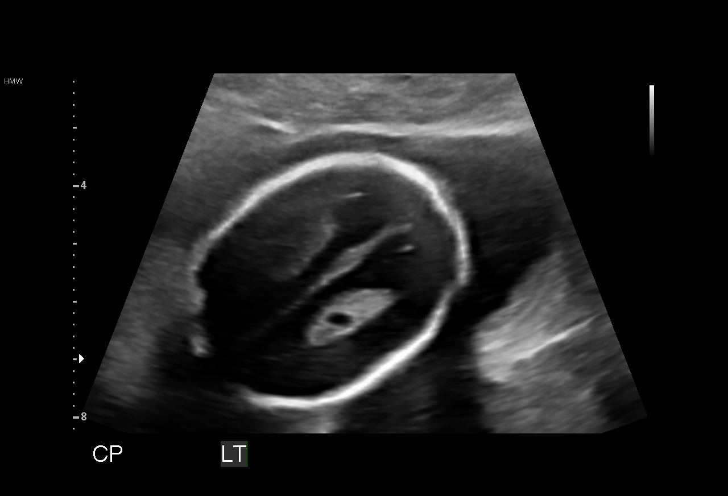
[im 31/83]
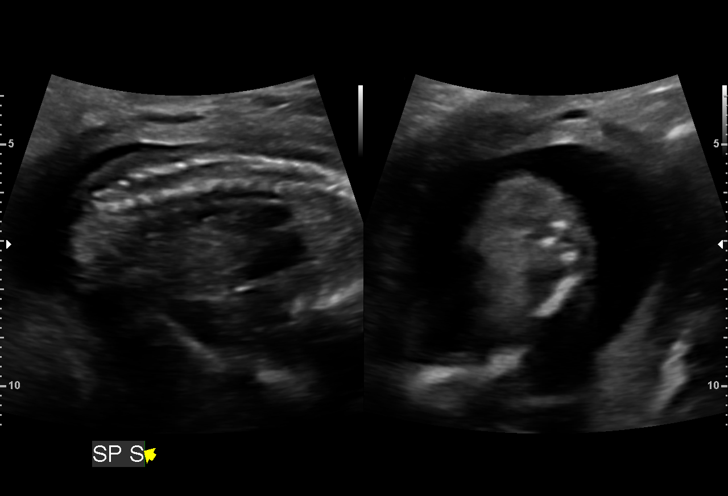
[im 37/83]
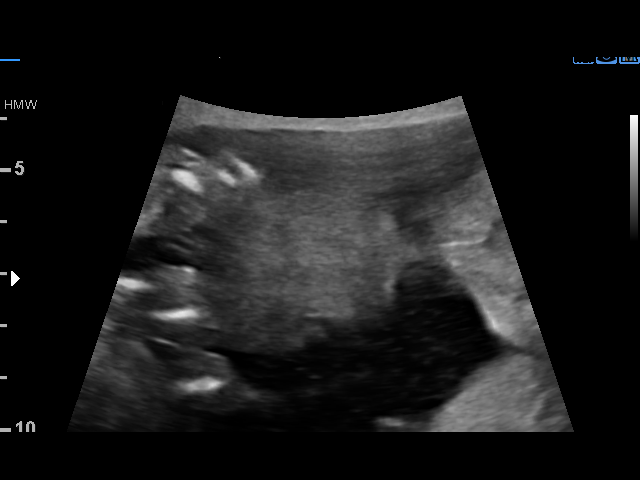
[im 46/83]
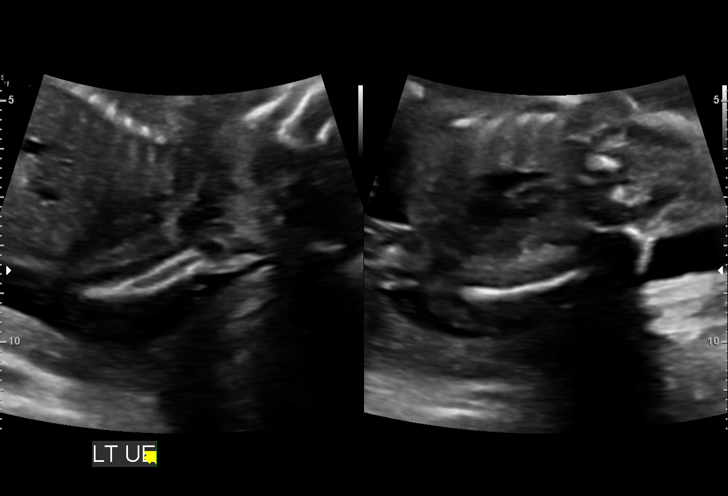
[im 52/83]
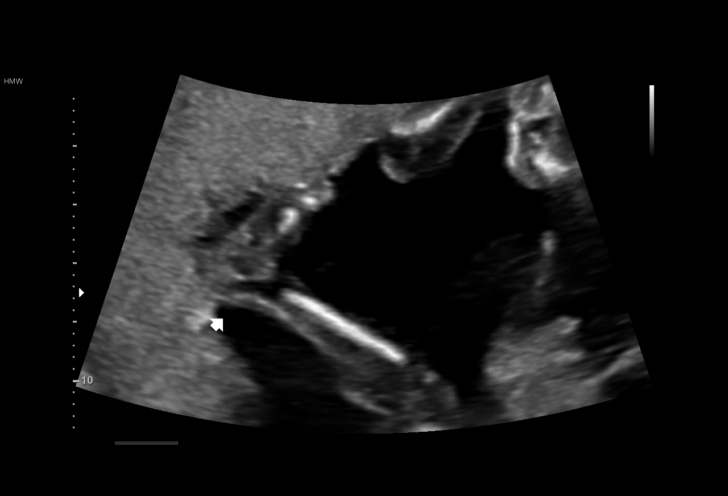
[im 58/83]
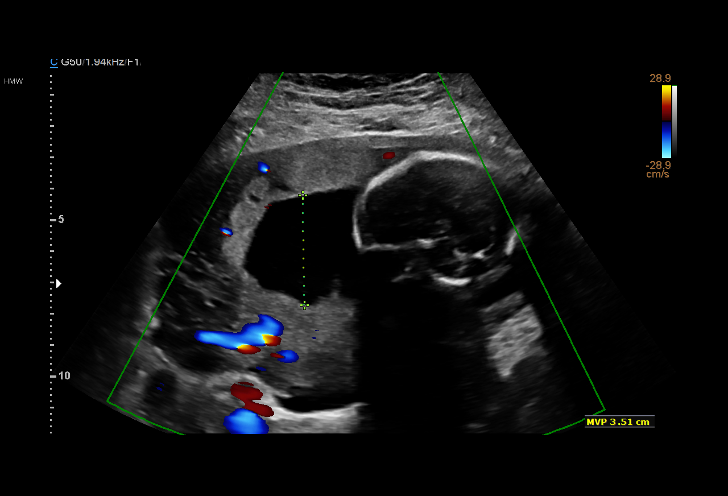
[im 67/83]
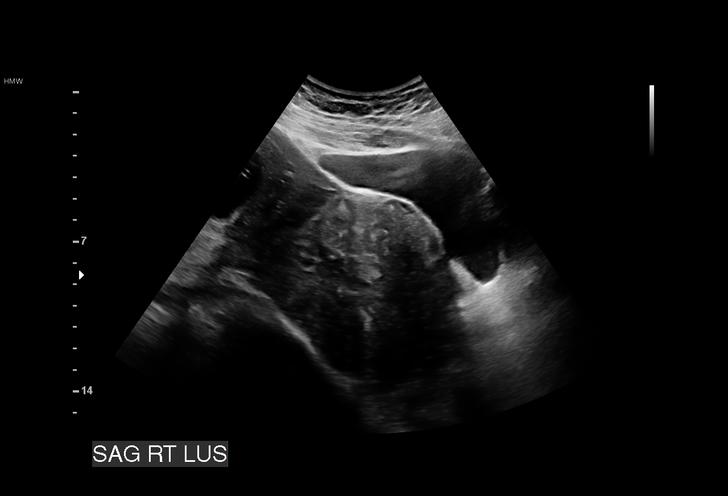
[im 73/83]
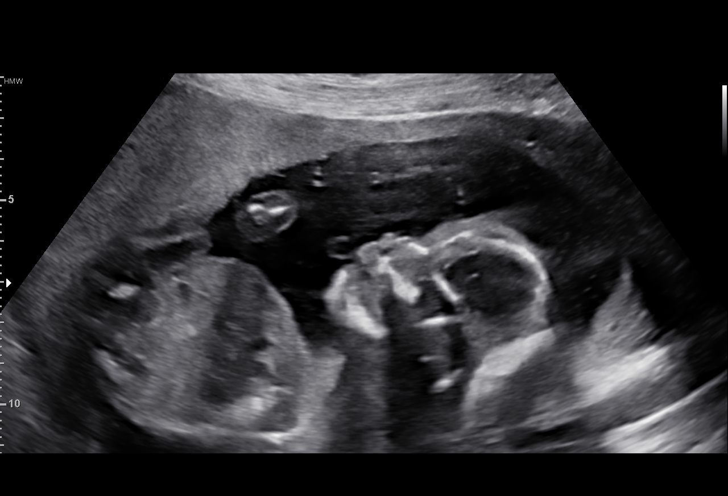
[im 79/83]
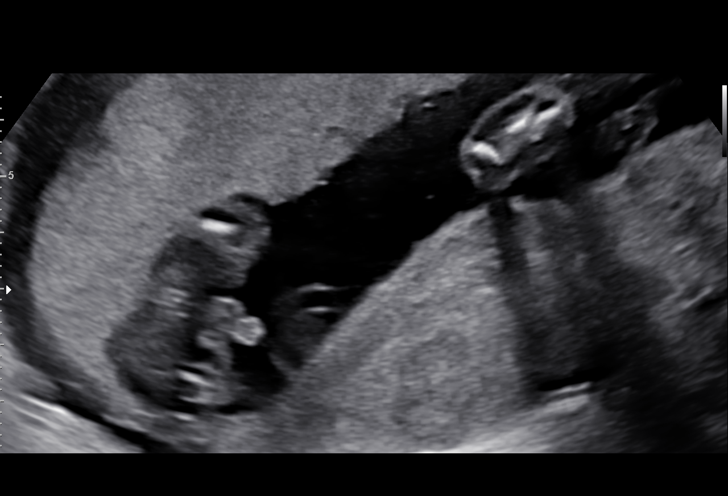

[12 of 28 positions shown; findings below may reference images not displayed]

----------------------------------------------------------------------

 ----------------------------------------------------------------------
Indications

  Advanced maternal age primigravida 34+,
  second trimester
  Uterine fibroids affecting pregnancy in        O34.12,
  second trimester, antepartum
  18 weeks gestation of pregnancy
  Encounter for antenatal screening for
  malformations
  Low risk NIPS, 8.944, AFP neg
 ----------------------------------------------------------------------
Fetal Evaluation

 Num Of Fetuses:         1
 Cardiac Activity:       Observed
 Presentation:           Cephalic
 Placenta:               Fundal
 P. Cord Insertion:      Visualized, central

 Amniotic Fluid
 AFI FV:      Within normal limits

                             Largest Pocket(cm)

Biometry

 BPD:      38.1  mm     G. Age:  17w 4d         13  %    CI:        62.52   %    70 - 86
                                                         FL/HC:      17.2   %    16.1 -
 HC:      155.5  mm     G. Age:  18w 3d         38  %    HC/AC:      1.09        1.09 -
 AC:      142.7  mm     G. Age:  19w 4d         80  %    FL/BPD:     70.1   %
 FL:       26.7  mm     G. Age:  18w 1d         28  %    FL/AC:      18.7   %    20 - 24
 HUM:      26.8  mm     G. Age:  18w 4d         51  %
 CER:      17.7  mm     G. Age:  17w 5d         25  %
 NFT:       2.9  mm
 CM:        4.3  mm
 Est. FW:     260  gm      0 lb 9 oz     62  %
OB History

 Gravidity:    1         Term:   0        Prem:   0        SAB:   0
 TOP:          0       Ectopic:  0        Living: 0
Gestational Age

 LMP:           18w 4d        Date:  11/12/18                 EDD:   08/19/19
 U/S Today:     18w 3d                                        EDD:   08/20/19
 Best:          18w 4d     Det. By:  LMP  (11/12/18)          EDD:   08/19/19
Anatomy

 Cranium:               Appears normal         LVOT:                   Not well visualized
 Cavum:                 Appears normal         Aortic Arch:            Not well visualized
 Ventricles:            Appears normal         Ductal Arch:            Not well visualized
 Choroid Plexus:        Left choroid           Diaphragm:              Appears normal
                        plexus cyst
 Cerebellum:            Appears normal         Stomach:                Appears normal, left
                                                                       sided
 Posterior Fossa:       Appears normal         Abdomen:                Appears normal
 Nuchal Fold:           Appears normal         Abdominal Wall:         Not well visualized
 Face:                  Appears normal         Cord Vessels:           Appears normal (3
                        (orbits and profile)                           vessel cord)
 Lips:                  Appears normal         Kidneys:                Appear normal
 Palate:                Not well visualized    Bladder:                Appears normal
 Thoracic:              Appears normal         Spine:                  Appears normal
 Heart:                 Not well visualized    Upper Extremities:      Appears normal
 RVOT:                  Not well visualized    Lower Extremities:      Appears normal

 Other:  Heels visualized. Hands not well visualized. Technically difficult due
         to fetal position.
Cervix Uterus Adnexa

 Cervix
 Length:            4.1  cm.
 Normal appearance by transabdominal scan.

 Uterus
 No abnormality visualized.

 Left Ovary
 No adnexal mass visualized.

 Right Ovary
 No adnexal mass visualized.

 Cul De Sac
 No free fluid seen.

 Adnexa
 No abnormality visualized.
Myomas

  Site                     L(cm)      W(cm)      D(cm)      Location
  RT LUS                   8.3        6.5        8
 ----------------------------------------------------------------------
  Blood Flow                 RI        PI       Comments

 ----------------------------------------------------------------------
Comments

 This patient was seen for a detailed fetal anatomy scan due
 to advanced maternal age. She denies any significant past
 medical history and denies any problems in her current
 pregnancy.
 She had a cell free DNA test earlier in her pregnancy which
 indicated a low risk for trisomy 21, 18, and 13. A male fetus is
 predicted.
 She was informed that the fetal growth and amniotic fluid
 level were appropriate for her gestational age.
 On today's exam, a left choroid plexus cyst was noted in the
 fetal brain.  The implications and management of choroid
 plexus cysts were discussed today.  She was advised that the
 choroid plexus cysts are most likely normal variants and will
 usually resolve at around 24 weeks.  The small association of
 choroid plexus cysts with trisomy 18 was discussed.  She
 was advised that as no other anomalies were noted on
 today's ultrasound exam, it is highly unlikely that her fetus
 has trisomy 18.
 Due to the small association between choroid plexus cysts
 and trisomy 18, she was offered and declined an
 amniocentesis today for definitive diagnosis of fetal
 aneuploidy.  She is comfortable with her negative cell free
 DNA test.
 The views of the fetal heart were suboptimal today due to the
 fetal position.
 The patient was informed that anomalies may be missed due
 to technical limitations. If the fetus is in a suboptimal position
 or maternal habitus is increased, visualization of the fetus in
 the maternal uterus may be impaired.
 An 8.3 x 6.5 cm fibroid in the lower uterine segment was
 noted today.  The increased risk of maternal pain issues and
 fetal growth issues later in her pregnancy due to the fibroids
 was discussed.
 Due to the fibroid uterus, we will continue to follow her with
 serial growth ultrasounds throughout her pregnancy.
 A follow-up exam was scheduled in 4 weeks to complete the
 views of the fetal anatomy and for follow-up of this choroid
 plexus cyst noted today.

## 2022-06-05 IMAGING — US US MFM OB FOLLOW-UP
1 series · 13 of 28 positions shown · non-contrast
Comparison: none

[Series 1: us mfm ob follow-up · 13 of 33 slices shown]
[im 2/33]
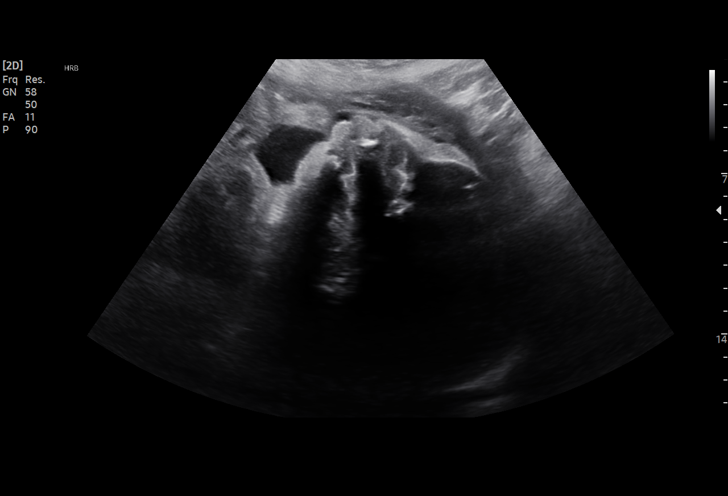
[im 4/33]
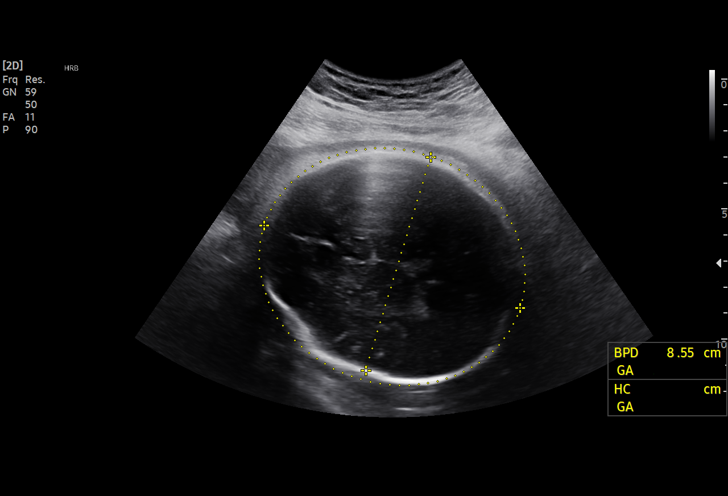
[im 6/33]
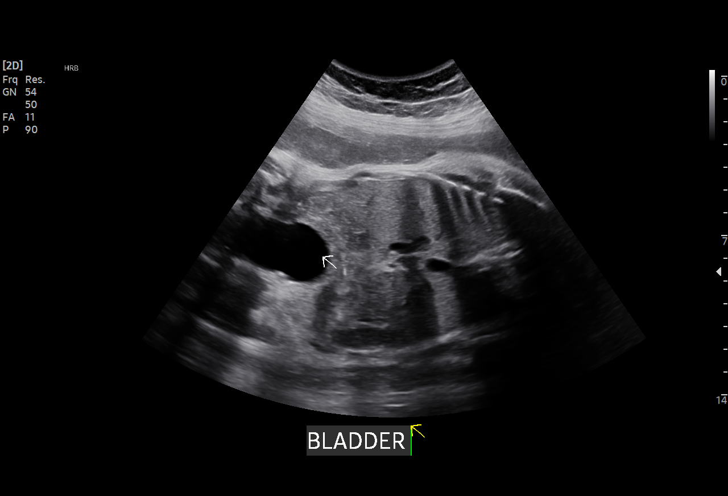
[im 9/33]
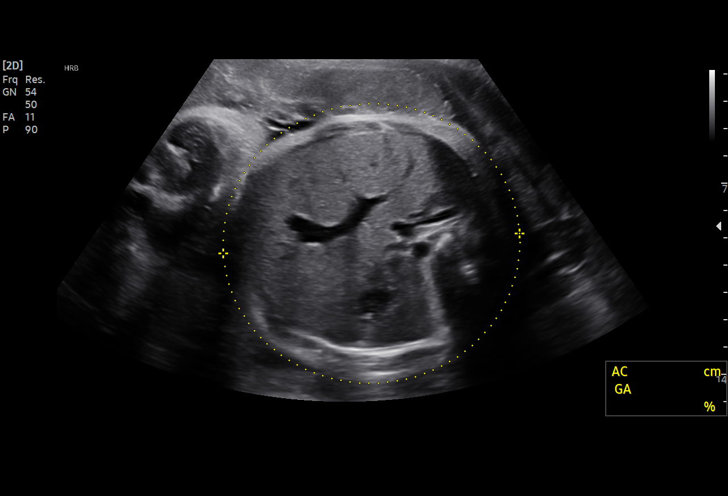
[im 11/33]
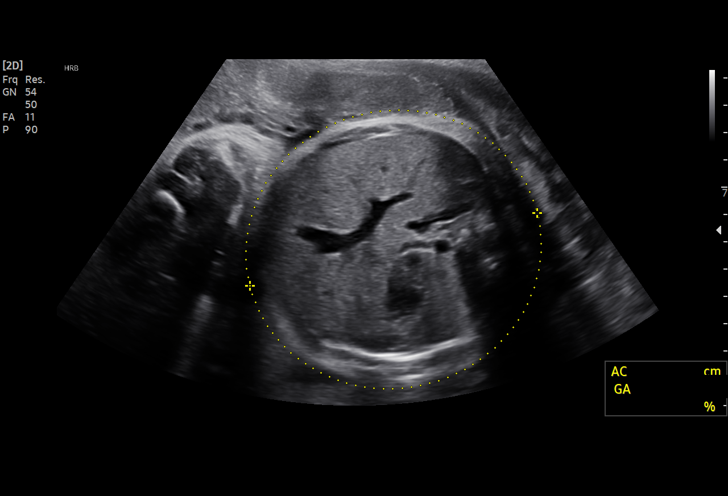
[im 14/33]
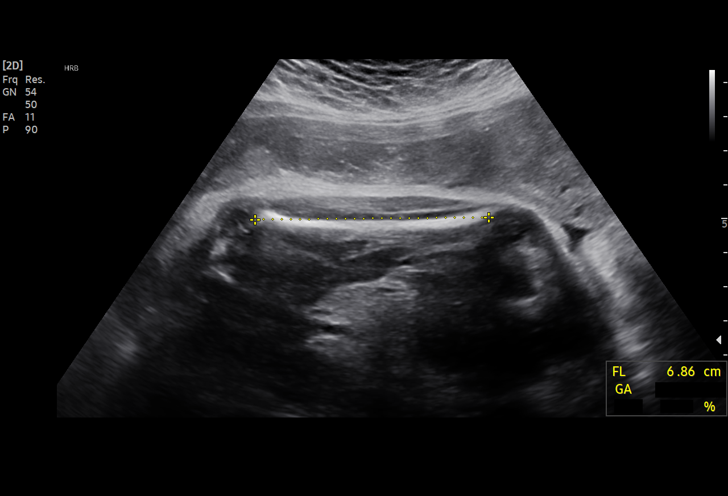
[im 17/33]
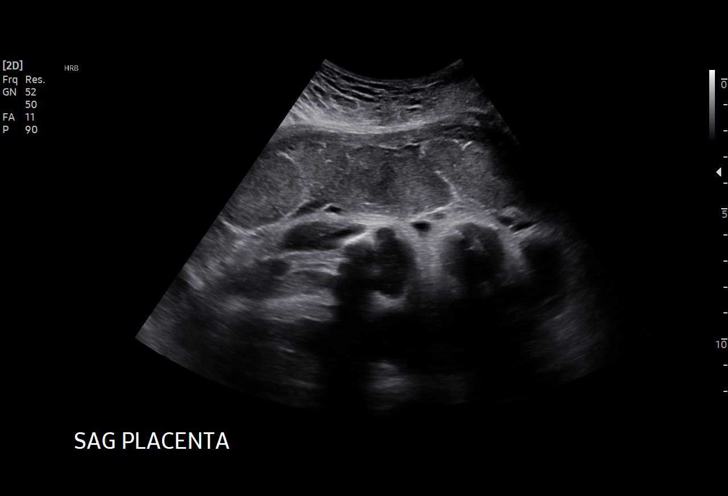
[im 19/33]
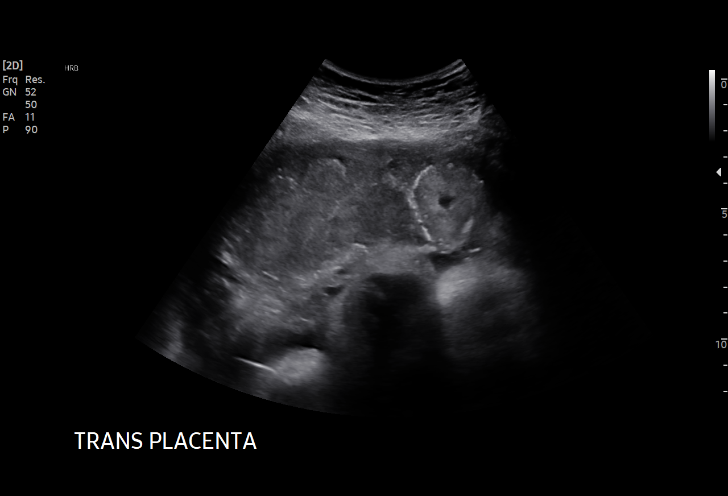
[im 22/33]
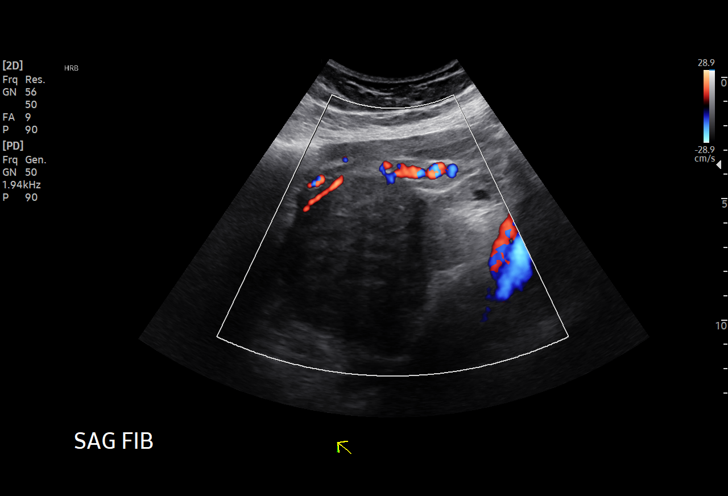
[im 24/33]
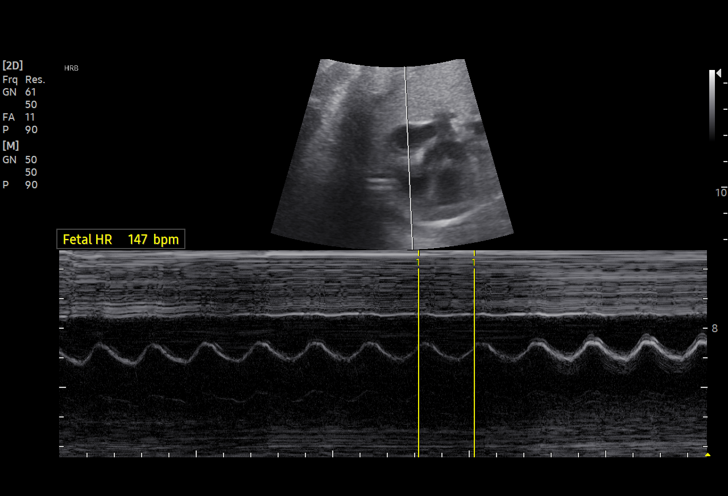
[im 27/33]
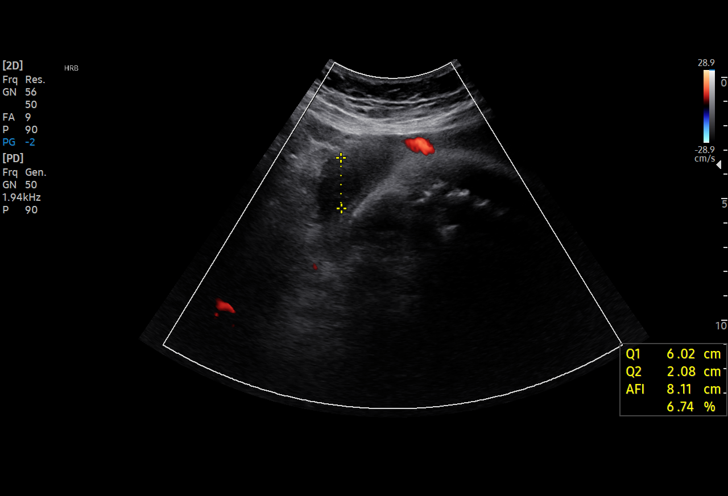
[im 29/33]
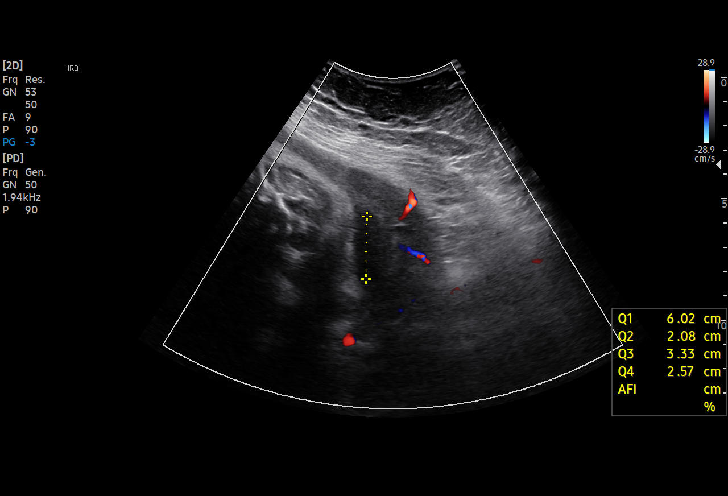
[im 31/33]
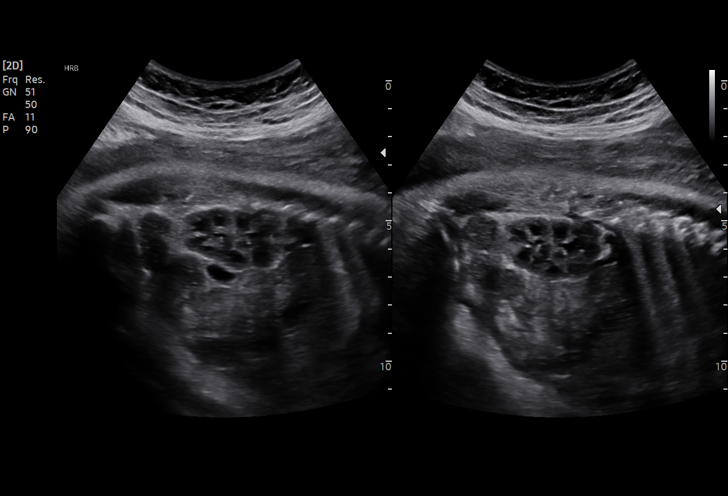

[13 of 28 positions shown; findings below may reference images not displayed]

[facility]

Indications

 Advanced maternal age primigravida 35+,
 second trimester
 Uterine fibroids affecting pregnancy in        O34.12,
 second trimester, antepartum
 Low risk NIPS, G.SWW, AFP neg
 Encounter for other antenatal screening
 follow-up
 36 weeks gestation of pregnancy
Vital Signs

 Weight (lb): 183
Fetal Evaluation

 Num Of Fetuses:         1
 Fetal Heart Rate(bpm):  147
 Cardiac Activity:       Observed
 Presentation:           Cephalic
 Placenta:               Anterior
 P. Cord Insertion:      Previously Visualized

 Amniotic Fluid
 AFI FV:      Within normal limits

 AFI Sum(cm)     %Tile       Largest Pocket(cm)
 14              52          6

 RUQ(cm)       RLQ(cm)       LUQ(cm)        LLQ(cm)
 6
Biometry
 BPD:      85.4  mm     G. Age:  34w 3d          8  %    CI:        79.75   %    70 - 86
                                                         FL/HC:      22.7   %    20.8 -
 HC:      302.2  mm     G. Age:  33w 4d        < 1  %    HC/AC:      0.92        0.92 -
 AC:      330.2  mm     G. Age:  37w 0d         69  %    FL/BPD:     80.4   %    71 - 87
 FL:       68.7  mm     G. Age:  35w 2d         15  %    FL/AC:      20.8   %    20 - 24

 Est. FW:    5311  gm      6 lb 2 oz     31  %
OB History

 Gravidity:    1         Term:   0        Prem:   0        SAB:   0
 TOP:          0       Ectopic:  0        Living: 0
Gestational Age

 LMP:           36w 5d        Date:  11/12/18                 EDD:   08/19/19
 U/S Today:     35w 1d                                        EDD:   08/30/19
 Best:          36w 5d     Det. By:  LMP  (11/12/18)          EDD:   08/19/19
Anatomy

 Cranium:               Appears normal         Aortic Arch:            Previously seen
 Cavum:                 Appears normal         Ductal Arch:            Not well visualized
 Ventricles:            Previously seen        Diaphragm:              Appears normal
 Choroid Plexus:        Resolved CPC           Stomach:                Appears normal, left
                                                                       sided
 Cerebellum:            Previously seen        Abdomen:                Appears normal
 Posterior Fossa:       Previously seen        Abdominal Wall:         Previously seen
 Nuchal Fold:           Previously seen        Cord Vessels:           Previously seen
 Face:                  Orbits and profile     Kidneys:                Appear normal
                        previously seen
 Lips:                  Previously seen        Bladder:                Appears normal
 Thoracic:              Appears normal         Spine:                  Previously seen
 Heart:                 Previously seen        Upper Extremities:      Previously seen
 RVOT:                  Previously seen        Lower Extremities:      Previously seen
 LVOT:                  Previously seen

 Other:  Heels visualized previously. Hands not well visualized. Technically
         difficult due to fetal position.
Cervix Uterus Adnexa

 Cervix
 Not visualized (advanced GA >59wks)
Myomas

 Site                     L(cm)      W(cm)      D(cm)       Location
 LUS

 Blood Flow                  RI       PI       Comments

Impression

 Patient returns for fetal growth assessment.  She does not
 have gestational diabetes.
 Amniotic fluid is normal and good fetal activity is seen .Fetal
 growth is appropriate for gestational age .  Lower uterine
 segment myoma is seen again, which is essentially
 unchanged from previous scans.  Patient does not have
 symptoms pertaining to the myoma.
 We reassured the patient of the findings.

 Her blood pressures today at our office are 148/98 mmHg

 severe headache or visual disturbances or right upper
 quadrant pain or vaginal bleeding.

 Patient has blood pressure cuff at home.  I encouraged her to
 check her blood pressures at least once daily.  I discussed
 the normal parameters.
Recommendations

 Follow-up scans as clinically indicated.
                 Davin, Charlix
